# Patient Record
Sex: Female | Born: 1965
Health system: Southern US, Community
[De-identification: ages and names within clinical notes are randomized; demographics above are authoritative.]

## PROBLEM LIST (undated history)

## (undated) DIAGNOSIS — M1990 Unspecified osteoarthritis, unspecified site: Secondary | ICD-10-CM

## (undated) DIAGNOSIS — Z9289 Personal history of other medical treatment: Secondary | ICD-10-CM

## (undated) DIAGNOSIS — D649 Anemia, unspecified: Secondary | ICD-10-CM

## (undated) DIAGNOSIS — R519 Headache, unspecified: Secondary | ICD-10-CM

## (undated) DIAGNOSIS — R51 Headache: Secondary | ICD-10-CM

## (undated) DIAGNOSIS — S46012A Strain of muscle(s) and tendon(s) of the rotator cuff of left shoulder, initial encounter: Secondary | ICD-10-CM

## (undated) DIAGNOSIS — K219 Gastro-esophageal reflux disease without esophagitis: Secondary | ICD-10-CM

## (undated) DIAGNOSIS — M7582 Other shoulder lesions, left shoulder: Secondary | ICD-10-CM

## (undated) HISTORY — DX: Headache: R51

## (undated) HISTORY — DX: Personal history of other medical treatment: Z92.89

## (undated) HISTORY — DX: Headache, unspecified: R51.9

## (undated) HISTORY — PX: WISDOM TOOTH EXTRACTION: SHX21

---

## 1989-04-16 HISTORY — PX: DILATION AND CURETTAGE OF UTERUS: SHX78

## 2005-06-27 ENCOUNTER — Ambulatory Visit: Payer: Self-pay | Admitting: Unknown Physician Specialty

## 2007-12-11 ENCOUNTER — Ambulatory Visit: Payer: Self-pay | Admitting: Family Medicine

## 2008-03-30 ENCOUNTER — Ambulatory Visit: Payer: Self-pay | Admitting: Family Medicine

## 2009-05-25 ENCOUNTER — Ambulatory Visit: Payer: Self-pay | Admitting: Family Medicine

## 2009-06-16 ENCOUNTER — Ambulatory Visit: Payer: Self-pay

## 2009-07-02 ENCOUNTER — Ambulatory Visit: Payer: Self-pay | Admitting: Internal Medicine

## 2011-02-20 ENCOUNTER — Ambulatory Visit: Payer: Self-pay | Admitting: Internal Medicine

## 2011-02-28 ENCOUNTER — Ambulatory Visit: Payer: Self-pay

## 2011-03-29 ENCOUNTER — Ambulatory Visit: Payer: Self-pay

## 2012-02-25 ENCOUNTER — Ambulatory Visit: Payer: Self-pay

## 2012-02-25 LAB — RAPID INFLUENZA A&B ANTIGENS

## 2012-11-12 ENCOUNTER — Ambulatory Visit: Payer: Self-pay | Admitting: Family Medicine

## 2014-03-02 ENCOUNTER — Ambulatory Visit: Payer: Self-pay | Admitting: Internal Medicine

## 2014-09-15 HISTORY — PX: COLONOSCOPY: SHX174

## 2014-12-17 ENCOUNTER — Ambulatory Visit: Payer: Federal, State, Local not specified - PPO

## 2014-12-17 ENCOUNTER — Encounter: Payer: Self-pay | Admitting: Emergency Medicine

## 2014-12-17 ENCOUNTER — Ambulatory Visit
Admission: EM | Admit: 2014-12-17 | Discharge: 2014-12-17 | Disposition: A | Discharge status: Home / Self Care | Payer: Federal, State, Local not specified - PPO | Attending: Family Medicine | Admitting: Family Medicine

## 2014-12-17 DIAGNOSIS — S46911A Strain of unspecified muscle, fascia and tendon at shoulder and upper arm level, right arm, initial encounter: Secondary | ICD-10-CM

## 2014-12-17 DIAGNOSIS — S40011A Contusion of right shoulder, initial encounter: Secondary | ICD-10-CM | POA: Diagnosis not present

## 2014-12-17 MED ORDER — KETOROLAC TROMETHAMINE 60 MG/2ML IM SOLN
60.0000 mg | Freq: Once | INTRAMUSCULAR | Status: AC
  Administered 2014-12-17: 60 mg via INTRAMUSCULAR

## 2014-12-17 MED ORDER — KETOROLAC TROMETHAMINE 10 MG PO TABS: 10.0000 mg | ORAL_TABLET | Freq: Three times a day (TID) | ORAL | Status: DC | PRN

## 2014-12-17 MED ORDER — CYCLOBENZAPRINE HCL 10 MG PO TABS: 10.0000 mg | ORAL_TABLET | Freq: Every day | ORAL | Status: DC

## 2014-12-17 NOTE — ED Provider Notes (Signed)
CSN: [id number]     Arrival date & time 12/17/14  7829 History   First MD Initiated Contact with Patient 12/17/14 0800     Chief Complaint  Patient presents with  . Fall  . Arm Pain    right   (Consider location/radiation/quality/duration/timing/severity/associated sxs/prior Treatment) HPI Comments: 49 yo female with a c/o right shoulder pain for one week after falling in her garage at home. States she slipped on the floor and landed on top of her right shoulder. Has had pain since. 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 Denies numbness, tingling, skin discoloration, fevers, chills. Has been icing and taking motrin with mild relief. Patient denies hitting her head when she fell or loss of consciousness.  Patient is a 49 y.o. female presenting with fall and arm pain. The history is provided by the patient.  Fall  Arm Pain    History reviewed. No pertinent past medical history. History reviewed. No pertinent past surgical history. History reviewed. No pertinent family history. Social History  Substance Use Topics  . Smoking status: Never Smoker   . Smokeless tobacco: Never Used  . Alcohol Use: No   OB History    No data available     Review of Systems  Allergies  Penicillins  Home Medications   Prior to Admission medications   Medication Sig Start Date End Date Taking? Authorizing Provider  Multiple Vitamin (MULTIVITAMIN) tablet Take 1 tablet by mouth daily.   Yes Historical Provider, MD  cyclobenzaprine (FLEXERIL) 10 MG tablet Take 1 tablet (10 mg total) by mouth at bedtime. 12/17/14   Payton Mccallum, MD  ketorolac (TORADOL) 10 MG tablet Take 1 tablet (10 mg total) by mouth every 8 (eight) hours as needed. 12/17/14   Payton Mccallum, MD   Meds Ordered and Administered this Visit   Medications  ketorolac (TORADOL) injection 60 mg (60 mg Intramuscular Given 12/17/14 0828)    BP 127/81 mmHg  Pulse 70  Temp(Src) 97.5 F (36.4  C) (Tympanic)  Resp 16  Ht  (1.651 m)  Wt 160 lb (72.576 kg)  BMI 26.63 kg/m2  SpO2 100%  LMP 12/03/2014 (Approximate) No data found.   Physical Exam  Constitutional: She appears well-developed and well-nourished. No distress.  HENT:  Head: Normocephalic and atraumatic.  Pulmonary/Chest: Effort normal. No respiratory distress.  Musculoskeletal: She exhibits no edema.       Right shoulder: She exhibits tenderness (over the deltoid muscle), pain (with abduction over 90 degress ) and spasm (over the right trapezius and deltoid muscle). She exhibits normal range of motion, no bony tenderness, no swelling, no effusion, no crepitus, no deformity, no laceration and normal strength.  Neurological: She is alert. She has normal reflexes. She displays normal reflexes. No cranial nerve deficit. She exhibits normal muscle tone. Coordination normal.  Skin: She is not diaphoretic.  Nursing note and vitals reviewed.   ED Course  Procedures (including critical care time)  Labs Review Labs Reviewed - No data to display  Imaging Review Dg Shoulder Right  12/17/2014   CLINICAL DATA:  Status post fall 1 week ago onto a concrete floor with a blow to the right shoulder. Continued pain. Initial encounter.  EXAM: RIGHT SHOULDER - 2+ VIEW  COMPARISON:  None.  FINDINGS: There is no acute bony or joint abnormality. Mild to moderate acromioclavicular osteoarthritis is noted. Image right lung and ribs appear normal.  IMPRESSION: No acute finding.  Mild to moderate acromioclavicular osteoarthritis.   Electronically Signed   By: Drusilla Kanner M.D.   On: 12/17/2014 08:42     Visual Acuity Review  Right Eye Distance:   Left Eye Distance:   Bilateral Distance:    Right Eye Near:   Left Eye Near:    Bilateral Near:         MDM   1. Shoulder contusion, right, initial encounter   2. Shoulder strain, right, initial encounter    New Prescriptions   CYCLOBENZAPRINE (FLEXERIL) 10 MG TABLET     Take 1 tablet (10 mg total) by mouth at bedtime.   KETOROLAC (TORADOL) 10 MG TABLET    Take 1 tablet (10 mg total) by mouth every 8 (eight) hours as needed.    Plan: 1. x-ray results and diagnosis reviewed with patient 2. rx as per orders; risks, benefits, potential side effects reviewed with patient 3. Patient given Toradol 60mg  IM x 1 in clinic with improvement of pain symptom 4. Recommend supportive treatment with ice/heat, range of motion exercises 5. F/u prn if symptoms worsen or don't improve    Payton Mccallum, MD 12/17/14 [phone]

## 2014-12-17 NOTE — ED Notes (Signed)
Patient c/o right arm pain for the past week after falling in her garage.  Patient denies hitting her head and any other injuries.

## 2015-07-20 DIAGNOSIS — J208 Acute bronchitis due to other specified organisms: Secondary | ICD-10-CM | POA: Diagnosis not present

## 2015-07-20 DIAGNOSIS — J019 Acute sinusitis, unspecified: Secondary | ICD-10-CM | POA: Diagnosis not present

## 2015-07-20 DIAGNOSIS — B9689 Other specified bacterial agents as the cause of diseases classified elsewhere: Secondary | ICD-10-CM | POA: Diagnosis not present

## 2015-09-21 ENCOUNTER — Other Ambulatory Visit: Payer: Self-pay | Admitting: Obstetrics and Gynecology

## 2015-09-21 DIAGNOSIS — Z1151 Encounter for screening for human papillomavirus (HPV): Secondary | ICD-10-CM | POA: Diagnosis not present

## 2015-09-21 DIAGNOSIS — Z1239 Encounter for other screening for malignant neoplasm of breast: Secondary | ICD-10-CM | POA: Diagnosis not present

## 2015-09-21 DIAGNOSIS — Z124 Encounter for screening for malignant neoplasm of cervix: Secondary | ICD-10-CM | POA: Diagnosis not present

## 2015-09-21 DIAGNOSIS — Z1231 Encounter for screening mammogram for malignant neoplasm of breast: Secondary | ICD-10-CM

## 2015-09-21 DIAGNOSIS — Z01419 Encounter for gynecological examination (general) (routine) without abnormal findings: Secondary | ICD-10-CM | POA: Diagnosis not present

## 2015-09-21 LAB — HM PAP SMEAR

## 2015-10-06 ENCOUNTER — Ambulatory Visit
Admission: RE | Admit: 2015-10-06 | Discharge: 2015-10-06 | Disposition: A | Discharge status: Home / Self Care | Payer: Federal, State, Local not specified - PPO | Source: Ambulatory Visit | Attending: Obstetrics and Gynecology | Admitting: Obstetrics and Gynecology

## 2015-10-06 ENCOUNTER — Other Ambulatory Visit: Payer: Self-pay | Admitting: Obstetrics and Gynecology

## 2015-10-06 DIAGNOSIS — Z1231 Encounter for screening mammogram for malignant neoplasm of breast: Secondary | ICD-10-CM | POA: Diagnosis not present

## 2015-10-06 LAB — HM MAMMOGRAPHY

## 2015-12-06 DIAGNOSIS — K080 Exfoliation of teeth due to systemic causes: Secondary | ICD-10-CM | POA: Diagnosis not present

## 2016-03-19 DIAGNOSIS — M9904 Segmental and somatic dysfunction of sacral region: Secondary | ICD-10-CM | POA: Diagnosis not present

## 2016-03-19 DIAGNOSIS — M5408 Panniculitis affecting regions of neck and back, sacral and sacrococcygeal region: Secondary | ICD-10-CM | POA: Diagnosis not present

## 2016-03-19 DIAGNOSIS — M9903 Segmental and somatic dysfunction of lumbar region: Secondary | ICD-10-CM | POA: Diagnosis not present

## 2016-03-28 DIAGNOSIS — M9903 Segmental and somatic dysfunction of lumbar region: Secondary | ICD-10-CM | POA: Diagnosis not present

## 2016-03-28 DIAGNOSIS — M5408 Panniculitis affecting regions of neck and back, sacral and sacrococcygeal region: Secondary | ICD-10-CM | POA: Diagnosis not present

## 2016-03-28 DIAGNOSIS — M9904 Segmental and somatic dysfunction of sacral region: Secondary | ICD-10-CM | POA: Diagnosis not present

## 2016-04-16 DIAGNOSIS — Z8614 Personal history of Methicillin resistant Staphylococcus aureus infection: Secondary | ICD-10-CM

## 2016-04-16 HISTORY — DX: Personal history of Methicillin resistant Staphylococcus aureus infection: Z86.14

## 2016-04-26 DIAGNOSIS — M5408 Panniculitis affecting regions of neck and back, sacral and sacrococcygeal region: Secondary | ICD-10-CM | POA: Diagnosis not present

## 2016-04-26 DIAGNOSIS — M9903 Segmental and somatic dysfunction of lumbar region: Secondary | ICD-10-CM | POA: Diagnosis not present

## 2016-04-26 DIAGNOSIS — M9904 Segmental and somatic dysfunction of sacral region: Secondary | ICD-10-CM | POA: Diagnosis not present

## 2016-05-22 DIAGNOSIS — K080 Exfoliation of teeth due to systemic causes: Secondary | ICD-10-CM | POA: Diagnosis not present

## 2016-07-06 ENCOUNTER — Ambulatory Visit
Admission: EM | Admit: 2016-07-06 | Discharge: 2016-07-06 | Disposition: A | Discharge status: Home / Self Care | Payer: Federal, State, Local not specified - PPO | Attending: Family Medicine | Admitting: Family Medicine

## 2016-07-06 DIAGNOSIS — M7989 Other specified soft tissue disorders: Secondary | ICD-10-CM | POA: Diagnosis not present

## 2016-07-06 DIAGNOSIS — L02412 Cutaneous abscess of left axilla: Secondary | ICD-10-CM | POA: Diagnosis not present

## 2016-07-06 DIAGNOSIS — Z880 Allergy status to penicillin: Secondary | ICD-10-CM | POA: Diagnosis not present

## 2016-07-06 DIAGNOSIS — L0291 Cutaneous abscess, unspecified: Secondary | ICD-10-CM

## 2016-07-06 DIAGNOSIS — Z6827 Body mass index (BMI) 27.0-27.9, adult: Secondary | ICD-10-CM | POA: Diagnosis not present

## 2016-07-06 NOTE — ED Provider Notes (Signed)
CSN: [id number]     Arrival date & time 07/06/16  1807 History   First MD Initiated Contact with Patient 07/06/16 1842     Chief Complaint  Patient presents with  . Abscess   (Consider location/radiation/quality/duration/timing/severity/associated sxs/prior Treatment) HPI  Subjective 51 year old female who presents with a abscess in her left axilla that she's had for last 3 days. States that she has not been feeling well today and worsened as the day progressed. She is also been having episodes of hot alternating with cold but her temperature is afebrile today. She became alarmed because the abscess and hardness had extended medially.       History reviewed. No pertinent past medical history. History reviewed. No pertinent surgical history. Family History  Problem Relation Age of Onset  . Breast cancer Neg Hx    Social History  Substance Use Topics  . Smoking status: Never Smoker  . Smokeless tobacco: Never Used  . Alcohol use No   OB History    No data available     Review of Systems  Constitutional: Positive for activity change, chills, fatigue and fever.  Skin: Positive for color change.  All other systems reviewed and are negative.   Allergies  Penicillins  Home Medications   Prior to Admission medications   Medication Sig Start Date End Date Taking? Authorizing Provider  cyclobenzaprine (FLEXERIL) 10 MG tablet Take 1 tablet (10 mg total) by mouth at bedtime. 12/17/14   Payton Mccallum, MD  ketorolac (TORADOL) 10 MG tablet Take 1 tablet (10 mg total) by mouth every 8 (eight) hours as needed. 12/17/14   Payton Mccallum, MD  Multiple Vitamin (MULTIVITAMIN) tablet Take 1 tablet by mouth daily.    Historical Provider, MD   Meds Ordered and Administered this Visit  Medications - No data to display  BP (!) 150/87 (BP Location: Right Arm)   Pulse (!) 106   Temp 98.4 F (36.9 C) (Oral)   Resp 17   Ht 5\' 5"  (1.651 m)   Wt 165 lb (74.8 kg)   LMP 07/02/2016 (Exact Date)    SpO2 100%   BMI 27.46 kg/m  No data found.   Physical Exam  Constitutional: She is oriented to person, place, and time. She appears well-developed and well-nourished. No distress.  HENT:  Head: Normocephalic and atraumatic.  Eyes: Pupils are equal, round, and reactive to light. Right eye exhibits no discharge. Left eye exhibits no discharge.  Musculoskeletal: Normal range of motion.  Neurological: She is alert and oriented to person, place, and time.  Skin: Skin is warm and dry. She is not diaphoretic. There is erythema.  Examination of the left axilla shows abscess in the axillary area that is starting to point at one portion. Is indurated and does not feel fluctuant. However the patient has erythema extending medially and inferiorly from the axilla anterior axillary line that is large indurated and tender.  Psychiatric: She has a normal mood and affect. Her behavior is normal. Judgment and thought content normal.  Nursing note and vitals reviewed.   Urgent Care Course     Procedures (including critical care time)  Labs Review Labs Reviewed - No data to display  Imaging Review No results found.   Visual Acuity Review  Right Eye Distance:   Left Eye Distance:   Bilateral Distance:    Right Eye Near:   Left Eye Near:    Bilateral Near:         MDM   1. Abscess  2. Abscess of left axilla    She was discharged to emergency department because of the need for higher level of care. Also examined by Dr. Thurmond ButtsWade who concurred. She left our facility in stable condition. Will be transported by privately owned vehicle.    Lutricia FeilWilliam P Hutson Luft, PA-C 07/06/16 1904

## 2016-07-06 NOTE — ED Triage Notes (Signed)
Pt c/o red swollen, painful area of the left axillary for the past 3 days.

## 2016-10-03 ENCOUNTER — Ambulatory Visit: Payer: Self-pay | Admitting: Obstetrics and Gynecology

## 2016-11-15 DIAGNOSIS — K080 Exfoliation of teeth due to systemic causes: Secondary | ICD-10-CM | POA: Diagnosis not present

## 2016-11-27 ENCOUNTER — Ambulatory Visit: Payer: Self-pay | Admitting: Obstetrics and Gynecology

## 2016-11-27 ENCOUNTER — Ambulatory Visit (INDEPENDENT_AMBULATORY_CARE_PROVIDER_SITE_OTHER): Payer: Federal, State, Local not specified - PPO | Admitting: Obstetrics and Gynecology

## 2016-11-27 ENCOUNTER — Encounter: Payer: Self-pay | Admitting: Obstetrics and Gynecology

## 2016-11-27 VITALS — BP 124/78 | Ht 65.0 in | Wt 176.0 lb

## 2016-11-27 DIAGNOSIS — Z1239 Encounter for other screening for malignant neoplasm of breast: Secondary | ICD-10-CM

## 2016-11-27 DIAGNOSIS — Z0000 Encounter for general adult medical examination without abnormal findings: Secondary | ICD-10-CM

## 2016-11-27 DIAGNOSIS — Z131 Encounter for screening for diabetes mellitus: Secondary | ICD-10-CM

## 2016-11-27 DIAGNOSIS — Z1231 Encounter for screening mammogram for malignant neoplasm of breast: Secondary | ICD-10-CM | POA: Diagnosis not present

## 2016-11-27 DIAGNOSIS — Z01419 Encounter for gynecological examination (general) (routine) without abnormal findings: Secondary | ICD-10-CM | POA: Diagnosis not present

## 2016-11-27 DIAGNOSIS — Z13220 Encounter for screening for lipoid disorders: Secondary | ICD-10-CM

## 2016-11-27 NOTE — Progress Notes (Signed)
Chief Complaint  Patient presents with  . Annual Exam    HPI:      Ms. Kristina Carrillo is a 51 y.o. [id number] who LMP was Patient's last menstrual period was 11/06/2016., presents today for her annual examination.  Her menses are Q4-6 wks, lasting 6 days; light flow. Menses have improved from last yr.  Dysmenorrhea none. She does not have intermenstrual bleeding.  She does have vasomotor sx occas. Sex activity: single partner, contraception - vasectomy. She does not have vaginal dryness; uses lubricants  Last Pap: September 21, 2015  Results were: no abnormalities /neg HPV DNA.  Hx of STDs: none  Last mammogram: October 06, 2015  Results were: normal--routine follow-up in 12 months There is no FH of breast cancer. There is no FH of ovarian cancer. The patient does do self-breast exams.  Colonoscopy: colonoscopy 2 years ago without abnormalities. Repeat due in 10 yrs, FH colon cancer in [facility]  Tobacco use: The patient denies current or previous tobacco use. Alcohol use: none Exercise: very active  She does get adequate calcium and Vitamin D in her diet. She had borderline lipids 2016 and didn't have repeat labs last yr. She is willing to do labs this yr.  Past Medical History:  Diagnosis Date  . Head ache    MENSTRUAL  . History of mammogram 07/20/14; 10/06/15   BIRADS 2; NEG  . History of Papanicolaou smear of cervix 06/16/2012; 09/21/15   -/-; -/-    Past Surgical History:  Procedure Laterality Date  . COLONOSCOPY  09/2014   WNL - DR. REIN - REPEAT 35YRS  . DILATION AND CURETTAGE OF UTERUS  1991    Family History  Problem Relation Age of Onset  . Alzheimer's disease Paternal Aunt   . Alzheimer's disease Paternal Uncle   . Cancer Maternal Grandmother 60       COLON  . Alzheimer's disease Paternal Grandfather   . Breast cancer Neg Hx     Social History   Social History  . Marital status: Married    Spouse name: N/A  . Number of children: 2  . 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 Smoking status: Never Smoker  . Smokeless tobacco: Never Used  . Alcohol use No  . Drug use: No  . Sexual activity: Yes    Birth control/ protection: Surgical     Comment: VASECTOMY   Other Topics Concern  . Not on file   Social History Narrative  . No narrative on file     Current Outpatient Prescriptions:  Marland Kitchen.  Multiple Vitamin (MULTIVITAMIN) tablet, Take 1 tablet by mouth daily., Disp: , Rfl:  .  Cholecalciferol (VITAMIN D-3) 5000 units TABS, Take 1 tablet by mouth daily., Disp: , Rfl:  .  cyclobenzaprine (FLEXERIL) 10 MG tablet, Take 1 tablet (10 mg total) by mouth at bedtime. (Patient not taking: Reported on 11/27/2016), Disp: 15 tablet, Rfl: 0 .  ketorolac (TORADOL) 10 MG tablet, Take 1 tablet (10 mg total) by mouth every 8 (eight) hours as needed. (Patient not taking: Reported on 11/27/2016), Disp: 15 tablet, Rfl: 0 .  loratadine (CLARITIN) 10 MG tablet, Take 10 mg by mouth daily., Disp: , Rfl:    ROS:  Review of Systems  Constitutional: Negative for fatigue, fever and unexpected weight change.  Respiratory: Negative for cough, shortness of breath and wheezing.   Cardiovascular: Negative for chest pain,  palpitations and leg swelling.  Gastrointestinal: Negative for blood in stool, constipation, diarrhea, nausea and vomiting.  Endocrine: Negative for cold intolerance, heat intolerance and polyuria.  Genitourinary: Negative for dyspareunia, dysuria, flank pain, frequency, genital sores, hematuria, menstrual problem, pelvic pain, urgency, vaginal bleeding, vaginal discharge and vaginal pain.  Musculoskeletal: Negative for back pain, joint swelling and myalgias.  Skin: Negative for rash.  Neurological: Negative for dizziness, syncope, light-headedness, numbness and headaches.  Hematological: Negative for adenopathy.  Psychiatric/Behavioral: Negative for agitation, confusion, sleep disturbance and  suicidal ideas. The patient is not nervous/anxious.      Objective: BP 124/78   Ht 5\' 5"  (1.651 m)   Wt 176 lb (79.8 kg)   LMP 11/06/2016   BMI 29.29 kg/m    Physical Exam  Constitutional: She is oriented to person, place, and time. She appears well-developed and well-nourished.  Genitourinary: Vagina normal and uterus normal. There is no rash or tenderness on the right labia. There is no rash or tenderness on the left labia. No erythema or tenderness in the vagina. No vaginal discharge found. Right adnexum does not display mass and does not display tenderness. Left adnexum does not display mass and does not display tenderness. Cervix does not exhibit motion tenderness or polyp. Uterus is not enlarged or tender.  Neck: Normal range of motion. No thyromegaly present.  Cardiovascular: Normal rate, regular rhythm and normal heart sounds.   No murmur heard. Pulmonary/Chest: Effort normal and breath sounds normal. Right breast exhibits no mass, no nipple discharge, no skin change and no tenderness. Left breast exhibits no mass, no nipple discharge, no skin change and no tenderness.  Abdominal: Soft. There is no tenderness. There is no guarding.  Musculoskeletal: Normal range of motion.  Neurological: She is alert and oriented to person, place, and time. No cranial nerve deficit.  Psychiatric: She has a normal mood and affect. Her behavior is normal.  Vitals reviewed.   Assessment/Plan:  Encounter for annual routine gynecological examination  Screening for breast cancer - Pt to sched mammo. - Plan: MM DIGITAL SCREENING BILATERAL  Blood tests for routine general physical examination - Plan: Comprehensive metabolic panel, Lipid panel, Hemoglobin A1c  Screening cholesterol level - Plan: Lipid panel  Screening for diabetes mellitus - Plan: Hemoglobin A1c          GYN counsel mammography screening, menopause, adequate intake of calcium and vitamin D    F/U  Return in about 1 year  (around 11/27/2017).  Lysha Schrade B. Sharayah Renfrow, PA-C 11/27/2016 8:54 AM

## 2016-11-28 ENCOUNTER — Ambulatory Visit
Admission: RE | Admit: 2016-11-28 | Discharge: 2016-11-28 | Disposition: A | Discharge status: Home / Self Care | Payer: Federal, State, Local not specified - PPO | Source: Ambulatory Visit | Attending: Obstetrics and Gynecology | Admitting: Obstetrics and Gynecology

## 2016-11-28 DIAGNOSIS — Z1231 Encounter for screening mammogram for malignant neoplasm of breast: Secondary | ICD-10-CM | POA: Insufficient documentation

## 2016-11-28 DIAGNOSIS — Z1239 Encounter for other screening for malignant neoplasm of breast: Secondary | ICD-10-CM

## 2016-11-29 ENCOUNTER — Encounter: Payer: Self-pay | Admitting: Obstetrics and Gynecology

## 2017-05-09 DIAGNOSIS — K080 Exfoliation of teeth due to systemic causes: Secondary | ICD-10-CM | POA: Diagnosis not present

## 2017-06-30 DIAGNOSIS — W208XXA Other cause of strike by thrown, projected or falling object, initial encounter: Secondary | ICD-10-CM | POA: Diagnosis not present

## 2017-06-30 DIAGNOSIS — S01412A Laceration without foreign body of left cheek and temporomandibular area, initial encounter: Secondary | ICD-10-CM | POA: Diagnosis not present

## 2017-06-30 DIAGNOSIS — R6884 Jaw pain: Secondary | ICD-10-CM | POA: Diagnosis not present

## 2017-11-15 DIAGNOSIS — K080 Exfoliation of teeth due to systemic causes: Secondary | ICD-10-CM | POA: Diagnosis not present

## 2017-12-15 IMAGING — MG MM DIGITAL SCREENING BILAT W/ TOMO W/ CAD
8 of 12 series · 8 of 28 positions shown · non-contrast
Comparison: Previous exam(s).

CLINICAL DATA: Screening.

EXAM:
2D DIGITAL SCREENING BILATERAL MAMMOGRAM WITH CAD AND ADJUNCT TOMO

[R MLO synth-2D]
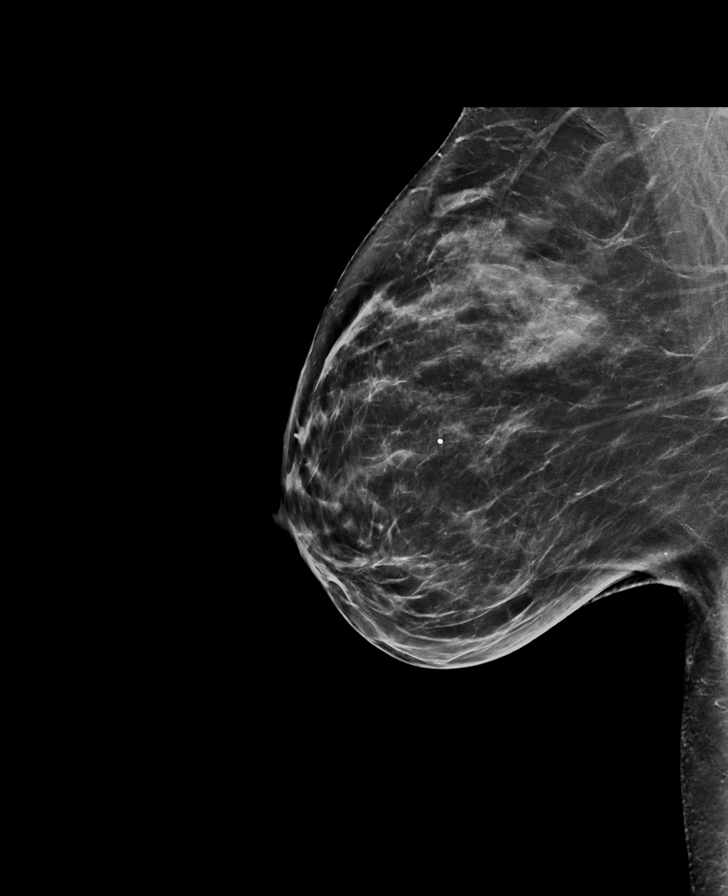

[R MLO]
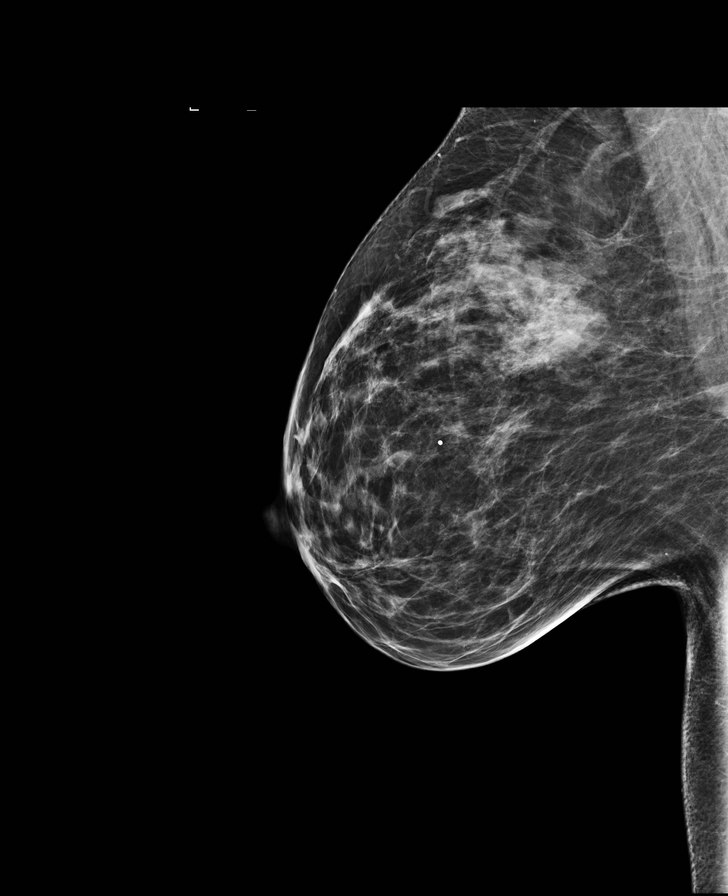

[R CC synth-2D]
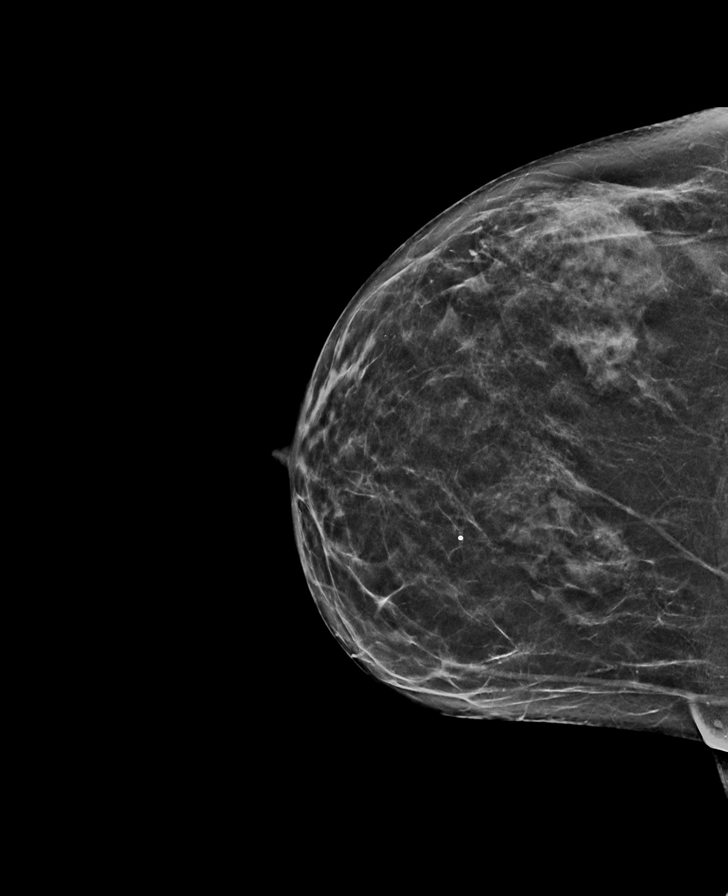

[L MLO]
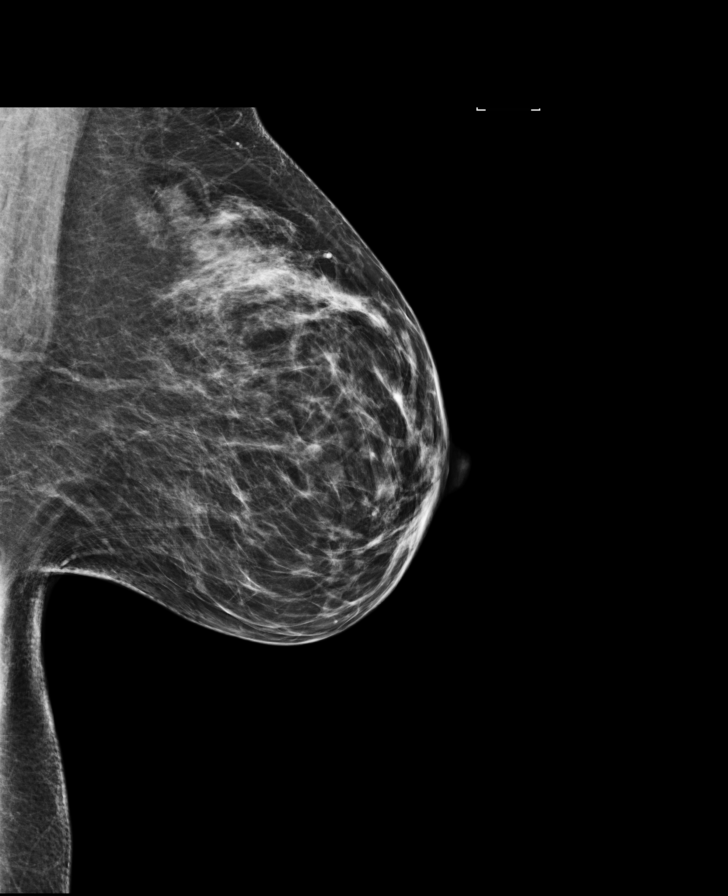

[L CC]
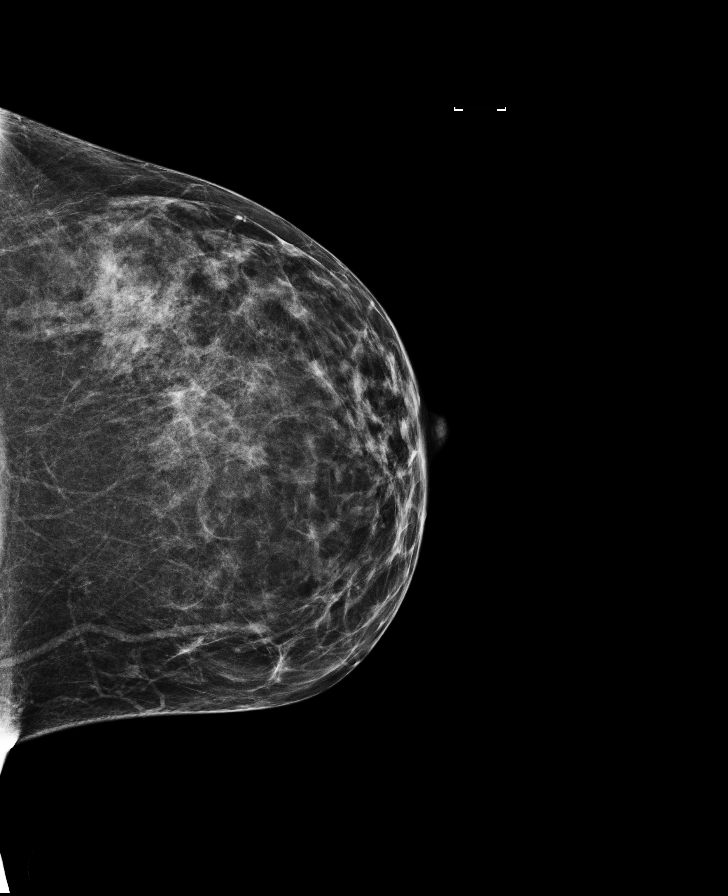

[L MLO synth-2D]
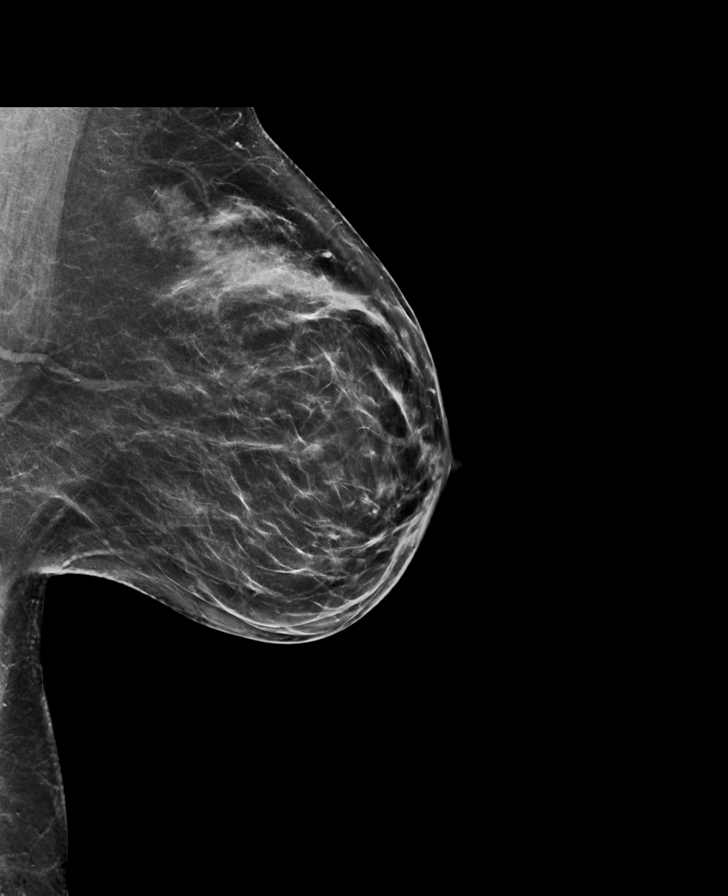

[L CC synth-2D]
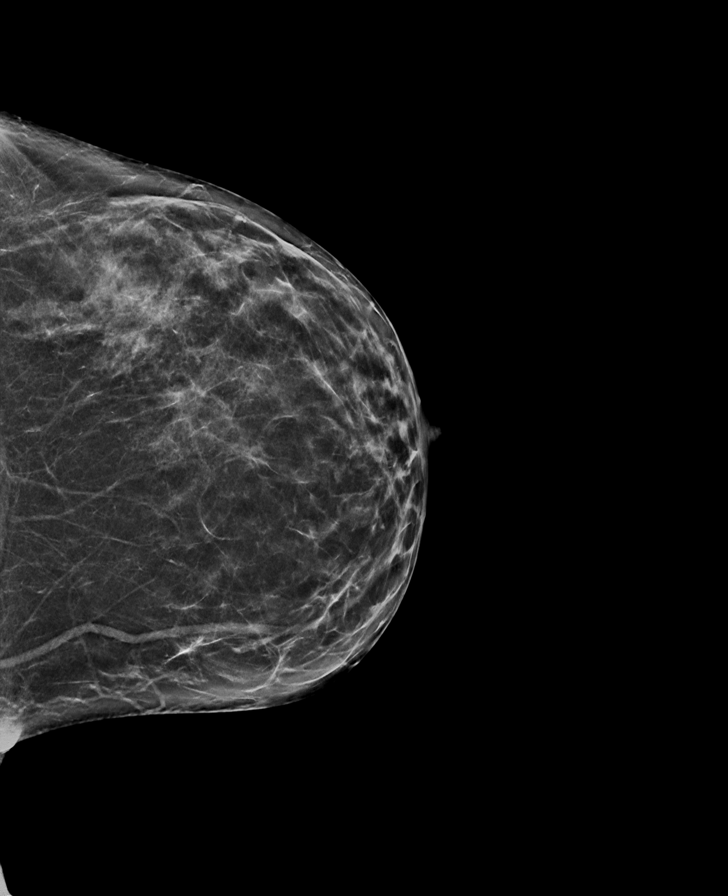

[R CC]
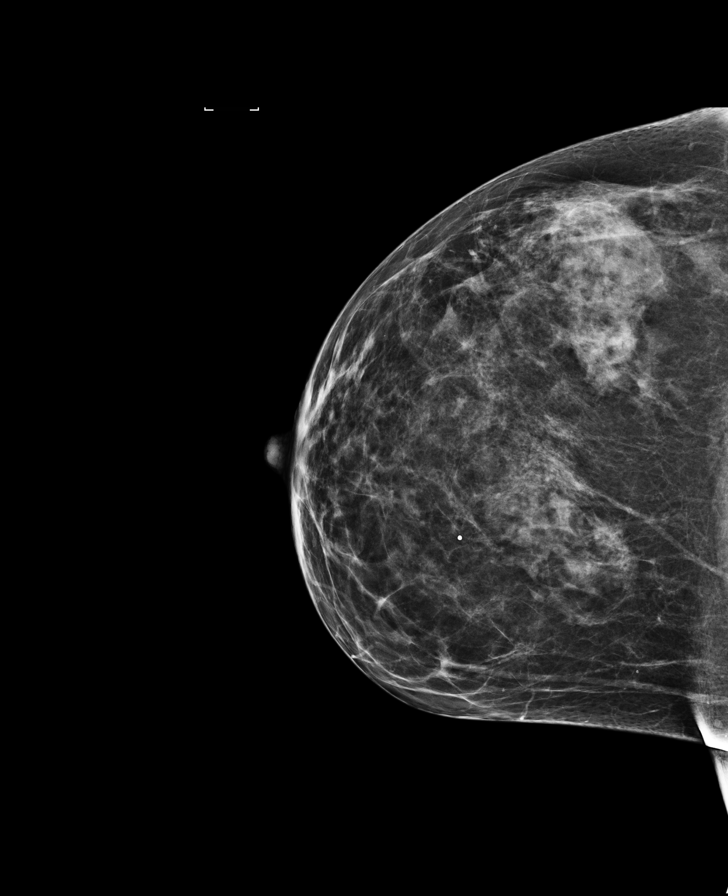

[8 of 28 positions shown; findings below may reference images not displayed]

ACR Breast Density Category c: The breast tissue is heterogeneously
dense, which may obscure small masses.
FINDINGS: There are no findings suspicious for malignancy. Images were
processed with CAD.
IMPRESSION: No mammographic evidence of malignancy. A result letter of this
screening mammogram will be mailed directly to the patient.

RECOMMENDATION:
Screening mammogram in one year. (Code:[id number]-[id number])

BI-RADS CATEGORY  1: Negative.

## 2017-12-31 DIAGNOSIS — K080 Exfoliation of teeth due to systemic causes: Secondary | ICD-10-CM | POA: Diagnosis not present

## 2018-01-23 DIAGNOSIS — K080 Exfoliation of teeth due to systemic causes: Secondary | ICD-10-CM | POA: Diagnosis not present

## 2018-01-31 ENCOUNTER — Encounter: Payer: Self-pay | Admitting: Obstetrics and Gynecology

## 2018-01-31 DIAGNOSIS — Z1231 Encounter for screening mammogram for malignant neoplasm of breast: Secondary | ICD-10-CM | POA: Diagnosis not present

## 2018-01-31 DIAGNOSIS — K080 Exfoliation of teeth due to systemic causes: Secondary | ICD-10-CM | POA: Diagnosis not present

## 2018-02-17 ENCOUNTER — Encounter: Payer: Self-pay | Admitting: Obstetrics and Gynecology

## 2018-03-03 DIAGNOSIS — K080 Exfoliation of teeth due to systemic causes: Secondary | ICD-10-CM | POA: Diagnosis not present

## 2018-04-30 DIAGNOSIS — K080 Exfoliation of teeth due to systemic causes: Secondary | ICD-10-CM | POA: Diagnosis not present

## 2018-05-05 DIAGNOSIS — M5408 Panniculitis affecting regions of neck and back, sacral and sacrococcygeal region: Secondary | ICD-10-CM | POA: Diagnosis not present

## 2018-05-05 DIAGNOSIS — M9903 Segmental and somatic dysfunction of lumbar region: Secondary | ICD-10-CM | POA: Diagnosis not present

## 2018-05-05 DIAGNOSIS — M9904 Segmental and somatic dysfunction of sacral region: Secondary | ICD-10-CM | POA: Diagnosis not present

## 2018-05-23 DIAGNOSIS — K080 Exfoliation of teeth due to systemic causes: Secondary | ICD-10-CM | POA: Diagnosis not present

## 2018-07-08 DIAGNOSIS — Z13220 Encounter for screening for lipoid disorders: Secondary | ICD-10-CM | POA: Diagnosis not present

## 2018-07-08 DIAGNOSIS — M25531 Pain in right wrist: Secondary | ICD-10-CM | POA: Diagnosis not present

## 2018-07-08 DIAGNOSIS — R202 Paresthesia of skin: Secondary | ICD-10-CM | POA: Diagnosis not present

## 2018-07-08 DIAGNOSIS — M47816 Spondylosis without myelopathy or radiculopathy, lumbar region: Secondary | ICD-10-CM | POA: Diagnosis not present

## 2018-07-08 DIAGNOSIS — Z1159 Encounter for screening for other viral diseases: Secondary | ICD-10-CM | POA: Diagnosis not present

## 2018-07-08 DIAGNOSIS — M79641 Pain in right hand: Secondary | ICD-10-CM | POA: Diagnosis not present

## 2018-07-08 DIAGNOSIS — Z131 Encounter for screening for diabetes mellitus: Secondary | ICD-10-CM | POA: Diagnosis not present

## 2018-07-08 DIAGNOSIS — M778 Other enthesopathies, not elsewhere classified: Secondary | ICD-10-CM | POA: Diagnosis not present

## 2018-07-08 DIAGNOSIS — Z114 Encounter for screening for human immunodeficiency virus [HIV]: Secondary | ICD-10-CM | POA: Diagnosis not present

## 2018-07-22 ENCOUNTER — Other Ambulatory Visit: Payer: Self-pay | Admitting: Acute Care

## 2018-07-22 DIAGNOSIS — R202 Paresthesia of skin: Secondary | ICD-10-CM | POA: Diagnosis not present

## 2018-07-22 DIAGNOSIS — M4807 Spinal stenosis, lumbosacral region: Secondary | ICD-10-CM

## 2018-08-22 ENCOUNTER — Other Ambulatory Visit: Payer: Self-pay

## 2018-08-22 ENCOUNTER — Ambulatory Visit
Admission: RE | Admit: 2018-08-22 | Discharge: 2018-08-22 | Disposition: A | Discharge status: Home / Self Care | Payer: Federal, State, Local not specified - PPO | Source: Ambulatory Visit | Attending: Acute Care | Admitting: Acute Care

## 2018-08-22 DIAGNOSIS — M48061 Spinal stenosis, lumbar region without neurogenic claudication: Secondary | ICD-10-CM | POA: Diagnosis not present

## 2018-08-22 DIAGNOSIS — M4807 Spinal stenosis, lumbosacral region: Secondary | ICD-10-CM | POA: Insufficient documentation

## 2018-08-22 DIAGNOSIS — M5127 Other intervertebral disc displacement, lumbosacral region: Secondary | ICD-10-CM | POA: Diagnosis not present

## 2018-09-02 DIAGNOSIS — R200 Anesthesia of skin: Secondary | ICD-10-CM | POA: Diagnosis not present

## 2018-09-17 DIAGNOSIS — R202 Paresthesia of skin: Secondary | ICD-10-CM | POA: Diagnosis not present

## 2018-10-03 DIAGNOSIS — M5136 Other intervertebral disc degeneration, lumbar region: Secondary | ICD-10-CM | POA: Diagnosis not present

## 2018-10-03 DIAGNOSIS — M5416 Radiculopathy, lumbar region: Secondary | ICD-10-CM | POA: Diagnosis not present

## 2018-11-14 DIAGNOSIS — M5136 Other intervertebral disc degeneration, lumbar region: Secondary | ICD-10-CM | POA: Diagnosis not present

## 2018-11-14 DIAGNOSIS — M5416 Radiculopathy, lumbar region: Secondary | ICD-10-CM | POA: Diagnosis not present

## 2019-01-26 ENCOUNTER — Telehealth: Payer: Self-pay | Admitting: Obstetrics and Gynecology

## 2019-01-26 ENCOUNTER — Other Ambulatory Visit: Payer: Self-pay | Admitting: Obstetrics and Gynecology

## 2019-01-26 DIAGNOSIS — Z1231 Encounter for screening mammogram for malignant neoplasm of breast: Secondary | ICD-10-CM

## 2019-01-26 NOTE — Telephone Encounter (Signed)
Pls notify pt. Thx

## 2019-01-26 NOTE — Telephone Encounter (Signed)
Pt will be going to [place].

## 2019-01-26 NOTE — Telephone Encounter (Signed)
Last time she did it in [place]. Is this where she wants to go again?

## 2019-01-26 NOTE — Telephone Encounter (Signed)
Pt aware.

## 2019-01-26 NOTE — Telephone Encounter (Signed)
Patient is calling requiring for a mammogram order. Please advise

## 2019-01-26 NOTE — Telephone Encounter (Signed)
Order placed. Pt will need to sign medical record release at [facility] for prior images from [facility] to be sent to them for mammo comparison.

## 2019-02-05 NOTE — Progress Notes (Signed)
Chief Complaint  Patient presents with  . Gynecologic Exam    HPI:      Ms. Kristina Carrillo is a 53 y.o. [id number] who LMP was No LMP recorded (lmp unknown). (Menstrual status: Irregular Periods)., presents today for her annual examination.  Her menses are Q3-6 months, lasting 3 days spotting.   Dysmenorrhea none. She does not have intermenstrual bleeding. She does have vasomotor sx occas.  Sex activity: single partner, contraception - vasectomy. She does not have vaginal dryness; uses lubricants  Last Pap: September 21, 2015  Results were: no abnormalities /neg HPV DNA.  Hx of STDs: none  Last mammogram: 01/31/18  Results were: normal--routine follow-up in 12 months There is no FH of breast cancer. There is no FH of ovarian cancer. The patient does do self-breast exams.  Colonoscopy: colonoscopy 2016 without abnormalities. Repeat due after 10 yrs, FH colon cancer in [facility]  Tobacco use: The patient denies current or previous tobacco use. Alcohol use: none  No drug use.  Exercise: very active  She does get adequate calcium and Vitamin D in her diet. Labs with PCP this yr.  Past Medical History:  Diagnosis Date  . Head ache    MENSTRUAL  . History of mammogram 07/20/14; 10/06/15   BIRADS 2; NEG  . History of Papanicolaou smear of cervix 06/16/2012; 09/21/15   -/-; -/-    Past Surgical History:  Procedure Laterality Date  . COLONOSCOPY  09/2014   WNL - DR. REIN - REPEAT 41YRS  . DILATION AND CURETTAGE OF UTERUS  1991    Family History  Problem Relation Age of Onset  . Alzheimer's disease Paternal Aunt   . Alzheimer's disease Paternal Uncle   . Cancer Maternal Grandmother 60       COLON  . Alzheimer's disease Paternal Grandfather   . Breast cancer Neg Hx     Social History   Socioeconomic History  . Marital status: Married    Spouse name: Not on file  . Number of children: 2  . Years of education: 65  . Highest education level: Not on file  Occupational History  .  Occupation: Equities trader  . Financial resource strain: Not on file  . Food insecurity    Worry: Not on file    Inability: Not on file  . Transportation needs    Medical: Not on file    Non-medical: Not on file  Tobacco Use  . Smoking status: Never Smoker  . Smokeless tobacco: Never Used  Substance and Sexual Activity  . Alcohol use: No  . Drug use: No  . Sexual activity: Yes    Birth control/protection: Surgical    Comment: VASECTOMY  Lifestyle  . Physical activity    Days per week: Not on file    Minutes per session: Not on file  . Stress: Not on file  Relationships  . Social Musician on phone: Not on file    Gets together: Not on file    Attends religious service: Not on file    Active member of club or organization: Not on file    Attends meetings of clubs or organizations: Not on file    Relationship status: Not on file  . Intimate partner violence    Fear of current or ex partner: Not on file    Emotionally abused: Not on file    Physically abused: Not on file    Forced sexual activity: Not on  file  Other Topics Concern  . Not on file  Social History Narrative  . Not on file     Current Outpatient Medications:  .  fluticasone (FLONASE) 50 MCG/ACT nasal spray, Place into the nose., Disp: , Rfl:  .  loratadine (CLARITIN) 10 MG tablet, Take 10 mg by mouth daily., Disp: , Rfl:  .  Multiple Vitamin (MULTIVITAMIN) tablet, Take 1 tablet by mouth daily., Disp: , Rfl:  .  pregabalin (LYRICA) 25 MG capsule, 1 po qHS, Disp: , Rfl:    ROS:  Review of Systems  Constitutional: Negative for fatigue, fever and unexpected weight change.  Respiratory: Negative for cough, shortness of breath and wheezing.   Cardiovascular: Negative for chest pain, palpitations and leg swelling.  Gastrointestinal: Negative for blood in stool, constipation, diarrhea, nausea and vomiting.  Endocrine: Negative for cold intolerance, heat intolerance and polyuria.   Genitourinary: Negative for dyspareunia, dysuria, flank pain, frequency, genital sores, hematuria, menstrual problem, pelvic pain, urgency, vaginal bleeding, vaginal discharge and vaginal pain.  Musculoskeletal: Negative for back pain, joint swelling and myalgias.  Skin: Negative for rash.  Neurological: Negative for dizziness, syncope, light-headedness, numbness and headaches.  Hematological: Negative for adenopathy.  Psychiatric/Behavioral: Negative for agitation, confusion, sleep disturbance and suicidal ideas. The patient is not nervous/anxious.      Objective: BP 102/74   Ht  (1.651 m)   Wt 148 lb (67.1 kg)   LMP  (LMP Unknown)   BMI 24.63 kg/m    Physical Exam Constitutional:      Appearance: She is well-developed.  Genitourinary:     Vulva, vagina, uterus, right adnexa and left adnexa normal.     No vulval lesion or tenderness noted.     No vaginal discharge, erythema or tenderness.     No cervical motion tenderness or polyp.     Uterus is not enlarged or tender.     No right or left adnexal mass present.     Right adnexa not tender.     Left adnexa not tender.  Neck:     Musculoskeletal: Normal range of motion.     Thyroid: No thyromegaly.  Cardiovascular:     Rate and Rhythm: Normal rate and regular rhythm.     Heart sounds: Normal heart sounds. No murmur.  Pulmonary:     Effort: Pulmonary effort is normal.     Breath sounds: Normal breath sounds.  Chest:     Breasts:        Right: No mass, nipple discharge, skin change or tenderness.        Left: No mass, nipple discharge, skin change or tenderness.  Abdominal:     Palpations: Abdomen is soft.     Tenderness: There is no abdominal tenderness. There is no guarding.  Musculoskeletal: Normal range of motion.  Neurological:     General: No focal deficit present.     Mental Status: She is alert and oriented to person, place, and time.     Cranial Nerves: No cranial nerve deficit.  Skin:    General: Skin  is warm and dry.  Psychiatric:        Mood and Affect: Mood normal.        Behavior: Behavior normal.        Thought Content: Thought content normal.        Judgment: Judgment normal.  Vitals signs reviewed.     Assessment/Plan:  Encounter for annual routine gynecological examination  Encounter for screening mammogram for  malignant neoplasm of breast - Plan: MM 3D SCREEN BREAST BILATERAL; pt to sched mammo  Perimenopause--f/u prn DUB  Needs flu shot  Need for immunization against influenza - Plan: Flu Vaccine QUAD 36+ mos IM          GYN counsel mammography screening, menopause, adequate intake of calcium and vitamin D    F/U  Return in about 1 year (around 02/06/2020).  Jahmai Finelli B. Skyah Hannon, PA-C 02/06/2019 2:21 PM

## 2019-02-06 ENCOUNTER — Ambulatory Visit (INDEPENDENT_AMBULATORY_CARE_PROVIDER_SITE_OTHER): Payer: Federal, State, Local not specified - PPO | Admitting: Obstetrics and Gynecology

## 2019-02-06 ENCOUNTER — Other Ambulatory Visit: Payer: Self-pay

## 2019-02-06 ENCOUNTER — Encounter: Payer: Self-pay | Admitting: Obstetrics and Gynecology

## 2019-02-06 VITALS — BP 102/74 | Ht 65.0 in | Wt 148.0 lb

## 2019-02-06 DIAGNOSIS — Z01419 Encounter for gynecological examination (general) (routine) without abnormal findings: Secondary | ICD-10-CM

## 2019-02-06 DIAGNOSIS — Z23 Encounter for immunization: Secondary | ICD-10-CM

## 2019-02-06 DIAGNOSIS — Z1231 Encounter for screening mammogram for malignant neoplasm of breast: Secondary | ICD-10-CM

## 2019-02-06 NOTE — Patient Instructions (Signed)
I value your feedback and entrusting us with your care. If you get a [facility] patient survey, I would appreciate you taking the time to let us know about your experience today. Thank you!  [facility]: [phone]  [facility]: [phone]  

## 2019-03-23 ENCOUNTER — Inpatient Hospital Stay: Admission: RE | Admit: 2019-03-23 | Payer: Federal, State, Local not specified - PPO | Source: Ambulatory Visit

## 2019-03-30 ENCOUNTER — Inpatient Hospital Stay: Admission: RE | Admit: 2019-03-30 | Payer: Federal, State, Local not specified - PPO | Source: Ambulatory Visit

## 2019-04-13 ENCOUNTER — Other Ambulatory Visit: Payer: Self-pay

## 2019-04-13 ENCOUNTER — Ambulatory Visit
Admission: RE | Admit: 2019-04-13 | Discharge: 2019-04-13 | Disposition: A | Discharge status: Home / Self Care | Payer: Federal, State, Local not specified - PPO | Source: Ambulatory Visit | Attending: Obstetrics and Gynecology | Admitting: Obstetrics and Gynecology

## 2019-04-13 DIAGNOSIS — Z1231 Encounter for screening mammogram for malignant neoplasm of breast: Secondary | ICD-10-CM | POA: Diagnosis not present

## 2019-04-14 ENCOUNTER — Other Ambulatory Visit: Payer: Self-pay | Admitting: Obstetrics and Gynecology

## 2019-04-14 DIAGNOSIS — R928 Other abnormal and inconclusive findings on diagnostic imaging of breast: Secondary | ICD-10-CM

## 2019-04-14 DIAGNOSIS — N6489 Other specified disorders of breast: Secondary | ICD-10-CM

## 2019-04-20 ENCOUNTER — Ambulatory Visit
Admission: RE | Admit: 2019-04-20 | Discharge: 2019-04-20 | Disposition: A | Discharge status: Home / Self Care | Payer: Federal, State, Local not specified - PPO | Source: Ambulatory Visit | Attending: Obstetrics and Gynecology | Admitting: Obstetrics and Gynecology

## 2019-04-20 DIAGNOSIS — R928 Other abnormal and inconclusive findings on diagnostic imaging of breast: Secondary | ICD-10-CM | POA: Insufficient documentation

## 2019-04-20 DIAGNOSIS — N6489 Other specified disorders of breast: Secondary | ICD-10-CM | POA: Insufficient documentation

## 2019-10-21 ENCOUNTER — Other Ambulatory Visit: Payer: Self-pay | Admitting: Obstetrics and Gynecology

## 2019-10-21 DIAGNOSIS — N6489 Other specified disorders of breast: Secondary | ICD-10-CM

## 2019-11-18 ENCOUNTER — Other Ambulatory Visit: Payer: Federal, State, Local not specified - PPO

## 2019-12-01 ENCOUNTER — Other Ambulatory Visit: Payer: Self-pay

## 2019-12-01 ENCOUNTER — Ambulatory Visit
Admission: RE | Admit: 2019-12-01 | Discharge: 2019-12-01 | Disposition: A | Discharge status: Home / Self Care | Payer: Federal, State, Local not specified - PPO | Source: Ambulatory Visit | Attending: Obstetrics and Gynecology | Admitting: Obstetrics and Gynecology

## 2019-12-01 DIAGNOSIS — N6489 Other specified disorders of breast: Secondary | ICD-10-CM | POA: Diagnosis present

## 2020-03-09 ENCOUNTER — Other Ambulatory Visit: Payer: Self-pay | Admitting: Family Medicine

## 2020-03-09 DIAGNOSIS — Z1231 Encounter for screening mammogram for malignant neoplasm of breast: Secondary | ICD-10-CM

## 2020-04-13 ENCOUNTER — Ambulatory Visit
Admission: RE | Admit: 2020-04-13 | Discharge: 2020-04-13 | Disposition: A | Discharge status: Home / Self Care | Payer: Federal, State, Local not specified - PPO | Source: Ambulatory Visit | Attending: Family Medicine | Admitting: Family Medicine

## 2020-04-13 ENCOUNTER — Other Ambulatory Visit: Payer: Self-pay

## 2020-04-13 DIAGNOSIS — Z1231 Encounter for screening mammogram for malignant neoplasm of breast: Secondary | ICD-10-CM | POA: Insufficient documentation

## 2021-03-06 ENCOUNTER — Other Ambulatory Visit: Payer: Self-pay | Admitting: Family Medicine

## 2021-03-06 DIAGNOSIS — Z1231 Encounter for screening mammogram for malignant neoplasm of breast: Secondary | ICD-10-CM

## 2021-04-14 ENCOUNTER — Other Ambulatory Visit: Payer: Self-pay

## 2021-04-14 ENCOUNTER — Ambulatory Visit
Admission: RE | Admit: 2021-04-14 | Discharge: 2021-04-14 | Disposition: A | Discharge status: Home / Self Care | Payer: Federal, State, Local not specified - PPO | Source: Ambulatory Visit | Attending: Family Medicine | Admitting: Family Medicine

## 2021-04-14 DIAGNOSIS — Z1231 Encounter for screening mammogram for malignant neoplasm of breast: Secondary | ICD-10-CM | POA: Diagnosis present

## 2022-03-13 ENCOUNTER — Other Ambulatory Visit: Payer: Self-pay | Admitting: Family Medicine

## 2022-03-13 DIAGNOSIS — Z1231 Encounter for screening mammogram for malignant neoplasm of breast: Secondary | ICD-10-CM

## 2022-04-17 ENCOUNTER — Ambulatory Visit: Payer: Federal, State, Local not specified - PPO

## 2022-04-25 ENCOUNTER — Ambulatory Visit
Admission: RE | Admit: 2022-04-25 | Discharge: 2022-04-25 | Disposition: A | Discharge status: Home / Self Care | Payer: Federal, State, Local not specified - PPO | Source: Ambulatory Visit | Attending: Family Medicine | Admitting: Family Medicine

## 2022-04-25 DIAGNOSIS — Z1231 Encounter for screening mammogram for malignant neoplasm of breast: Secondary | ICD-10-CM | POA: Insufficient documentation

## 2023-03-21 ENCOUNTER — Other Ambulatory Visit: Payer: Self-pay | Admitting: Family Medicine

## 2023-03-21 DIAGNOSIS — Z1231 Encounter for screening mammogram for malignant neoplasm of breast: Secondary | ICD-10-CM

## 2023-04-30 ENCOUNTER — Ambulatory Visit
Admission: RE | Admit: 2023-04-30 | Discharge: 2023-04-30 | Disposition: A | Discharge status: Home / Self Care | Payer: Federal, State, Local not specified - PPO | Source: Ambulatory Visit | Attending: Family Medicine | Admitting: Family Medicine

## 2023-04-30 DIAGNOSIS — Z1231 Encounter for screening mammogram for malignant neoplasm of breast: Secondary | ICD-10-CM | POA: Diagnosis present

## 2023-11-12 ENCOUNTER — Other Ambulatory Visit: Payer: Self-pay | Admitting: Surgery

## 2023-11-12 DIAGNOSIS — S46012A Strain of muscle(s) and tendon(s) of the rotator cuff of left shoulder, initial encounter: Secondary | ICD-10-CM

## 2023-11-12 DIAGNOSIS — M7582 Other shoulder lesions, left shoulder: Secondary | ICD-10-CM

## 2023-11-19 ENCOUNTER — Ambulatory Visit
Admission: RE | Admit: 2023-11-19 | Discharge: 2023-11-19 | Disposition: A | Discharge status: Home / Self Care | Source: Ambulatory Visit | Attending: Surgery | Admitting: Surgery

## 2023-11-19 DIAGNOSIS — S46012A Strain of muscle(s) and tendon(s) of the rotator cuff of left shoulder, initial encounter: Secondary | ICD-10-CM | POA: Diagnosis present

## 2023-11-19 DIAGNOSIS — M7582 Other shoulder lesions, left shoulder: Secondary | ICD-10-CM | POA: Insufficient documentation

## 2023-12-05 ENCOUNTER — Other Ambulatory Visit: Payer: Self-pay | Admitting: Surgery

## 2023-12-10 ENCOUNTER — Other Ambulatory Visit: Payer: Self-pay

## 2023-12-10 ENCOUNTER — Encounter
Admission: RE | Admit: 2023-12-10 | Discharge: 2023-12-10 | Disposition: A | Discharge status: Home / Self Care | Source: Ambulatory Visit | Attending: Surgery | Admitting: Surgery

## 2023-12-10 HISTORY — DX: Strain of muscle(s) and tendon(s) of the rotator cuff of left shoulder, initial encounter: S46.012A

## 2023-12-10 HISTORY — DX: Anemia, unspecified: D64.9

## 2023-12-10 HISTORY — DX: Other shoulder lesions, left shoulder: M75.82

## 2023-12-10 HISTORY — DX: Unspecified osteoarthritis, unspecified site: M19.90

## 2023-12-10 HISTORY — DX: Gastro-esophageal reflux disease without esophagitis: K21.9

## 2023-12-10 NOTE — Patient Instructions (Addendum)
 Your procedure is scheduled on: 12/18/23 - Wednesday Report to the Registration Desk on the 1st floor of the [facility]. To find out your arrival time, please call [phone] between 1PM - 3PM on: 12/17/23 - Tuesday If your arrival time is 6:00 am, do not arrive before that time as the [facility] entrance doors do not open until 6:00 am.  REMEMBER: Instructions that are not followed completely may result in serious medical risk, up to and including death; or upon the discretion of your surgeon and anesthesiologist your surgery may need to be rescheduled.  Do not eat food after midnight the night before surgery.  No gum chewing or hard candies.  You may however, drink CLEAR liquids up to 2 hours before you are scheduled to arrive for your surgery. Do not drink anything within 2 hours of your scheduled arrival time.  Clear liquids include: - water  - apple juice without pulp - gatorade (not RED colors) - black coffee or tea (Do NOT add milk or creamers to the coffee or tea) Do NOT drink anything that is not on this list.  In addition, your doctor has ordered for you to drink the provided:  Ensure Pre-Surgery Clear Carbohydrate Drink  Drinking this carbohydrate drink up to two hours before surgery helps to reduce insulin resistance and improve patient outcomes. Please complete drinking 2 hours before scheduled arrival time.  One week prior to surgery: Stop Anti-inflammatories (NSAIDS) such as Advil, Aleve, Ibuprofen, Motrin, Naproxen, Naprosyn and Aspirin based products such as Excedrin, Goody's Powder, BC Powder. You may take Tylenol if needed for pain up until the day of surgery.  Stop ANY OVER THE COUNTER supplements until after surgery. MultiVitamin, Vitamin D-Vitamin K .  ON THE DAY OF SURGERY ONLY TAKE THESE MEDICATIONS WITH SIPS OF WATER:  fluticasone (FLONASE)   No Alcohol for 24 hours before or after surgery.  No Smoking including e-cigarettes for 24 hours before  surgery.  No chewable tobacco products for at least 6 hours before surgery.  No nicotine patches on the day of surgery.  Do not use any recreational drugs for at least a week (preferably 2 weeks) before your surgery.  Please be advised that the combination of cocaine and anesthesia may have negative outcomes, up to and including death. If you test positive for cocaine, your surgery will be cancelled.  On the morning of surgery brush your teeth with toothpaste and water, you may rinse your mouth with mouthwash if you wish. Do not swallow any toothpaste or mouthwash.  Use CHG Soap or wipes as directed on instruction sheet.  Do not wear jewelry, make-up, hairpins, clips or nail polish.  For welded (permanent) jewelry: bracelets, anklets, waist bands, etc.  Please have this removed prior to surgery.  If it is not removed, there is a chance that hospital personnel will need to cut it off on the day of surgery.  Do not wear lotions, powders, or perfumes.   Do not shave body hair from the neck down 48 hours before surgery.  Contact lenses, hearing aids and dentures may not be worn into surgery.  Do not bring valuables to the hospital. [facility] is not responsible for any missing/lost belongings or valuables.   Notify your doctor if there is any change in your medical condition (cold, fever, infection).  Wear comfortable clothing (specific to your surgery type) to the hospital.  After surgery, you can help prevent lung complications by doing breathing exercises.  Take deep  breaths and cough every 1-2 hours. Your doctor may order a device called an Incentive Spirometer to help you take deep breaths.  When coughing or sneezing, hold a pillow firmly against your incision with both hands. This is called "splinting." Doing this helps protect your incision. It also decreases belly discomfort.  If you are being admitted to the hospital overnight, leave your suitcase in the car. After surgery  it may be brought to your room.  In case of increased patient census, it may be necessary for you, the patient, to continue your postoperative care in the Same Day Surgery department.  If you are being discharged the day of surgery, you will not be allowed to drive home. You will need a responsible individual to drive you home and stay with you for 24 hours after surgery.   If you are taking public transportation, you will need to have a responsible individual with you.  Please call the Pre-admissions Testing Dept. at [phone] if you have any questions about these instructions.  Surgery Visitation Policy:  Patients having surgery or a procedure may have two visitors.  Children under the age of 49 must have an adult with them who is not the patient.  Inpatient Visitation:    Visiting hours are 7 a.m. to 8 p.m. Up to four visitors are allowed at one time in a patient room. The visitors may rotate out with other people during the day.  One visitor age 34 or older may stay with the patient overnight and must be in the room by 8 p.m.   Merchandiser, retail to address health-related social needs:  [email or web address]                                                                                                             Preparing for Surgery with CHLORHEXIDINE GLUCONATE (CHG) Soap  Chlorhexidine Gluconate (CHG) Soap  o An antiseptic cleaner that kills germs and bonds with the skin to continue killing germs even after washing  o Used for showering the night before surgery and morning of surgery  Before surgery, you can play an important role by reducing the number of germs on your skin.  CHG (Chlorhexidine gluconate) soap is an antiseptic cleanser which kills germs and bonds with the skin to continue killing germs even after washing.  Please do not use if you have an allergy to CHG or antibacterial soaps. If your skin becomes reddened/irritated stop using  the CHG.  1. Shower the NIGHT BEFORE SURGERY and the MORNING OF SURGERY with CHG soap.  2. If you choose to wash your hair, wash your hair first as usual with your normal shampoo.  3. After shampooing, rinse your hair and body thoroughly to remove the shampoo.  4. Use CHG as you would any other liquid soap. You can apply CHG directly to the skin and wash gently with a scrungie or a clean washcloth.  5. Apply the CHG soap to your body only from the neck down. Do not use on open wounds  or open sores. Avoid contact with your eyes, ears, mouth, and genitals (private parts). Wash face and genitals (private parts) with your normal soap.  6. Wash thoroughly, paying special attention to the area where your surgery will be performed.  7. Thoroughly rinse your body with warm water.  8. Do not shower/wash with your normal soap after using and rinsing off the CHG soap.  9. Pat yourself dry with a clean towel.  10. Wear clean pajamas to bed the night before surgery.  12. Place clean sheets on your bed the night of your first shower and do not sleep with pets.  13. Shower again with the CHG soap on the day of surgery prior to arriving at the hospital.  14. Do not apply any deodorants/lotions/powders.  15. Please wear clean clothes to the hospital.

## 2023-12-18 ENCOUNTER — Ambulatory Visit: Payer: Self-pay | Admitting: Certified Registered"

## 2023-12-18 ENCOUNTER — Other Ambulatory Visit: Payer: Self-pay

## 2023-12-18 ENCOUNTER — Ambulatory Visit
Admission: RE | Admit: 2023-12-18 | Discharge: 2023-12-18 | Disposition: A | Discharge status: Home / Self Care | Attending: Surgery | Admitting: Surgery

## 2023-12-18 ENCOUNTER — Ambulatory Visit: Payer: Self-pay | Admitting: Urgent Care

## 2023-12-18 ENCOUNTER — Encounter: Payer: Self-pay | Admitting: Surgery

## 2023-12-18 ENCOUNTER — Encounter
Admission: RE | Disposition: A | Discharge status: Home / Self Care | Payer: Self-pay | Source: Home / Self Care | Attending: Surgery

## 2023-12-18 ENCOUNTER — Ambulatory Visit

## 2023-12-18 DIAGNOSIS — M7542 Impingement syndrome of left shoulder: Secondary | ICD-10-CM | POA: Insufficient documentation

## 2023-12-18 DIAGNOSIS — S46012A Strain of muscle(s) and tendon(s) of the rotator cuff of left shoulder, initial encounter: Secondary | ICD-10-CM | POA: Diagnosis present

## 2023-12-18 DIAGNOSIS — M25512 Pain in left shoulder: Secondary | ICD-10-CM | POA: Diagnosis present

## 2023-12-18 DIAGNOSIS — K219 Gastro-esophageal reflux disease without esophagitis: Secondary | ICD-10-CM | POA: Diagnosis not present

## 2023-12-18 DIAGNOSIS — M7582 Other shoulder lesions, left shoulder: Secondary | ICD-10-CM | POA: Diagnosis present

## 2023-12-18 DIAGNOSIS — X58XXXA Exposure to other specified factors, initial encounter: Secondary | ICD-10-CM | POA: Insufficient documentation

## 2023-12-18 DIAGNOSIS — Z79899 Other long term (current) drug therapy: Secondary | ICD-10-CM | POA: Diagnosis not present

## 2023-12-18 DIAGNOSIS — M7522 Bicipital tendinitis, left shoulder: Secondary | ICD-10-CM | POA: Diagnosis not present

## 2023-12-18 HISTORY — PX: SHOULDER OPEN ROTATOR CUFF REPAIR: SHX2407

## 2023-12-18 HISTORY — PX: SUBACROMIAL DECOMPRESSION: SHX5174

## 2023-12-18 HISTORY — PX: BICEPT TENODESIS: SHX5116

## 2023-12-18 HISTORY — PX: POSTERIOR LUMBAR FUSION 2 WITH HARDWARE REMOVAL: SHX7297

## 2023-12-18 SURGERY — ARTHROSCOPY, SHOULDER WITH DEBRIDEMENT
Anesthesia: General | Site: Shoulder | Laterality: Left

## 2023-12-18 MED ORDER — FENTANYL CITRATE (PF) 100 MCG/2ML IJ SOLN
INTRAMUSCULAR | Status: AC
  Filled 2023-12-18: qty 2

## 2023-12-18 MED ORDER — DEXMEDETOMIDINE HCL IN NACL 80 MCG/20ML IV SOLN
INTRAVENOUS | Status: DC | PRN
  Administered 2023-12-18: 8 ug via INTRAVENOUS
  Administered 2023-12-18: 4 ug via INTRAVENOUS

## 2023-12-18 MED ORDER — ROCURONIUM BROMIDE 100 MG/10ML IV SOLN
INTRAVENOUS | Status: DC | PRN
  Administered 2023-12-18: 60 mg via INTRAVENOUS

## 2023-12-18 MED ORDER — CHLORHEXIDINE GLUCONATE 0.12 % MT SOLN
15.0000 mL | Freq: Once | OROMUCOSAL | Status: AC
  Administered 2023-12-18: 15 mL via OROMUCOSAL

## 2023-12-18 MED ORDER — CEFAZOLIN SODIUM-DEXTROSE 2-4 GM/100ML-% IV SOLN
INTRAVENOUS | Status: AC
  Filled 2023-12-18: qty 100

## 2023-12-18 MED ORDER — BUPIVACAINE-EPINEPHRINE (PF) 0.5% -1:200000 IJ SOLN
INTRAMUSCULAR | Status: DC | PRN
  Administered 2023-12-18: 30 mL

## 2023-12-18 MED ORDER — DEXAMETHASONE SODIUM PHOSPHATE 10 MG/ML IJ SOLN
INTRAMUSCULAR | Status: DC | PRN
  Administered 2023-12-18: 10 mg via INTRAVENOUS

## 2023-12-18 MED ORDER — KETAMINE HCL 50 MG/5ML IJ SOSY
PREFILLED_SYRINGE | INTRAMUSCULAR | Status: AC
  Filled 2023-12-18: qty 5

## 2023-12-18 MED ORDER — BUPIVACAINE LIPOSOME 1.3 % IJ SUSP
INTRAMUSCULAR | Status: AC
  Filled 2023-12-18: qty 20

## 2023-12-18 MED ORDER — PROPOFOL 10 MG/ML IV BOLUS
INTRAVENOUS | Status: DC | PRN
  Administered 2023-12-18: 150 mg via INTRAVENOUS
  Administered 2023-12-18: 50 mg via INTRAVENOUS

## 2023-12-18 MED ORDER — SUGAMMADEX SODIUM 200 MG/2ML IV SOLN
INTRAVENOUS | Status: DC | PRN
  Administered 2023-12-18: 200 mg via INTRAVENOUS

## 2023-12-18 MED ORDER — VASOPRESSIN 20 UNIT/ML IV SOLN
INTRAVENOUS | Status: AC
Start: 2023-12-18 — End: 2023-12-18
  Filled 2023-12-18: qty 2

## 2023-12-18 MED ORDER — PROPOFOL 10 MG/ML IV BOLUS
INTRAVENOUS | Status: AC
Start: 2023-12-18 — End: 2023-12-18
  Filled 2023-12-18: qty 20

## 2023-12-18 MED ORDER — BUPIVACAINE-EPINEPHRINE (PF) 0.5% -1:200000 IJ SOLN
INTRAMUSCULAR | Status: AC
  Filled 2023-12-18: qty 30

## 2023-12-18 MED ORDER — PROPOFOL 10 MG/ML IV BOLUS
INTRAVENOUS | Status: AC
  Filled 2023-12-18: qty 20

## 2023-12-18 MED ORDER — ONDANSETRON HCL 4 MG/2ML IJ SOLN
INTRAMUSCULAR | Status: DC | PRN
  Administered 2023-12-18: 4 mg via INTRAVENOUS

## 2023-12-18 MED ORDER — BUPIVACAINE HCL (PF) 0.5 % IJ SOLN
INTRAMUSCULAR | Status: AC
  Filled 2023-12-18: qty 10

## 2023-12-18 MED ORDER — BUPIVACAINE LIPOSOME 1.3 % IJ SUSP
INTRAMUSCULAR | Status: DC | PRN
  Administered 2023-12-18: 20 mL

## 2023-12-18 MED ORDER — MIDAZOLAM HCL 2 MG/2ML IJ SOLN
1.0000 mg | INTRAMUSCULAR | Status: DC | PRN
  Administered 2023-12-18: 1 mg via INTRAVENOUS

## 2023-12-18 MED ORDER — KETOROLAC TROMETHAMINE 15 MG/ML IJ SOLN
15.0000 mg | Freq: Once | INTRAMUSCULAR | Status: AC
  Administered 2023-12-18: 15 mg via INTRAVENOUS

## 2023-12-18 MED ORDER — KETAMINE HCL 50 MG/5ML IJ SOSY
PREFILLED_SYRINGE | INTRAMUSCULAR | Status: DC | PRN
  Administered 2023-12-18: 30 mg via INTRAVENOUS
  Administered 2023-12-18 (×2): 10 mg via INTRAVENOUS

## 2023-12-18 MED ORDER — OXYCODONE HCL 5 MG PO TABS: 5.0000 mg | ORAL_TABLET | ORAL | 0 refills | Status: AC | PRN

## 2023-12-18 MED ORDER — PROPOFOL 1000 MG/100ML IV EMUL
INTRAVENOUS | Status: AC
  Filled 2023-12-18: qty 100

## 2023-12-18 MED ORDER — ORAL CARE MOUTH RINSE: 15.0000 mL | Freq: Once | OROMUCOSAL | Status: AC

## 2023-12-18 MED ORDER — FENTANYL CITRATE PF 50 MCG/ML IJ SOSY
50.0000 ug | PREFILLED_SYRINGE | Freq: Once | INTRAMUSCULAR | Status: AC
  Administered 2023-12-18: 50 ug via INTRAVENOUS

## 2023-12-18 MED ORDER — OXYCODONE HCL 5 MG PO TABS: 5.0000 mg | ORAL_TABLET | Freq: Once | ORAL | Status: DC | PRN

## 2023-12-18 MED ORDER — MIDAZOLAM HCL 2 MG/2ML IJ SOLN
INTRAMUSCULAR | Status: AC
  Filled 2023-12-18: qty 2

## 2023-12-18 MED ORDER — CEFAZOLIN SODIUM-DEXTROSE 2-4 GM/100ML-% IV SOLN
2.0000 g | INTRAVENOUS | Status: AC
  Administered 2023-12-18: 2 g via INTRAVENOUS

## 2023-12-18 MED ORDER — OXYCODONE HCL 5 MG/5ML PO SOLN: 5.0000 mg | Freq: Once | ORAL | Status: DC | PRN

## 2023-12-18 MED ORDER — BUPIVACAINE HCL (PF) 0.5 % IJ SOLN
INTRAMUSCULAR | Status: DC | PRN
  Administered 2023-12-18: 10 mL

## 2023-12-18 MED ORDER — CHLORHEXIDINE GLUCONATE 0.12 % MT SOLN
OROMUCOSAL | Status: AC
  Filled 2023-12-18: qty 15

## 2023-12-18 MED ORDER — RINGERS IRRIGATION IR SOLN
Status: DC | PRN
  Administered 2023-12-18: 1500 mL

## 2023-12-18 MED ORDER — LACTATED RINGERS IV SOLN: INTRAVENOUS | Status: DC

## 2023-12-18 MED ORDER — KETOROLAC TROMETHAMINE 15 MG/ML IJ SOLN
INTRAMUSCULAR | Status: AC
  Filled 2023-12-18: qty 1

## 2023-12-18 MED ORDER — PHENYLEPHRINE 80 MCG/ML (10ML) SYRINGE FOR IV PUSH (FOR BLOOD PRESSURE SUPPORT)
PREFILLED_SYRINGE | INTRAVENOUS | Status: DC | PRN
  Administered 2023-12-18: 80 ug via INTRAVENOUS

## 2023-12-18 MED ORDER — PHENYLEPHRINE HCL-NACL 20-0.9 MG/250ML-% IV SOLN
INTRAVENOUS | Status: AC
Start: 2023-12-18 — End: 2023-12-18
  Filled 2023-12-18: qty 250

## 2023-12-18 MED ORDER — FENTANYL CITRATE (PF) 100 MCG/2ML IJ SOLN: 25.0000 ug | INTRAMUSCULAR | Status: DC | PRN

## 2023-12-18 MED ORDER — FENTANYL CITRATE PF 50 MCG/ML IJ SOSY
PREFILLED_SYRINGE | INTRAMUSCULAR | Status: AC
Start: 2023-12-18 — End: 2023-12-18
  Filled 2023-12-18: qty 1

## 2023-12-18 SURGICAL SUPPLY — 45 items
ANCHOR DBL 2.6 SLF-PNCH FIBRTK (Anchor) IMPLANT
ANCHOR HEALICOIL REGEN 5.5 (Anchor) IMPLANT
ANCHOR QFIX 2.8 SUT MINI TAPE (Anchor) IMPLANT
BIT DRILL JUGRKNT W/NDL BIT2.9 (DRILL) IMPLANT
BLADE FULL RADIUS 3.5 (BLADE) ×2 IMPLANT
BUR ACROMIONIZER 4.0 (BURR) ×2 IMPLANT
CHLORAPREP W/TINT 26 (MISCELLANEOUS) ×2 IMPLANT
COVER MAYO STAND STRL (DRAPES) ×2 IMPLANT
DILATOR 5.5 THREADED HEALICOIL (MISCELLANEOUS) IMPLANT
ELECTRODE REM PT RTRN 9FT ADLT (ELECTROSURGICAL) ×2 IMPLANT
GAUZE SPONGE 4X4 12PLY STRL (GAUZE/BANDAGES/DRESSINGS) ×2 IMPLANT
GAUZE XEROFORM 1X8 LF (GAUZE/BANDAGES/DRESSINGS) ×2 IMPLANT
GLOVE BIO SURGEON STRL SZ7.5 (GLOVE) ×4 IMPLANT
GLOVE BIO SURGEON STRL SZ8 (GLOVE) ×4 IMPLANT
GLOVE BIOGEL PI IND STRL 8 (GLOVE) ×2 IMPLANT
GLOVE INDICATOR 8.0 STRL GRN (GLOVE) ×2 IMPLANT
GOWN STRL REUS W/ TWL LRG LVL3 (GOWN DISPOSABLE) ×2 IMPLANT
GOWN STRL REUS W/ TWL XL LVL3 (GOWN DISPOSABLE) ×2 IMPLANT
IV LR IRRIG 3000ML ARTHROMATIC (IV SOLUTION) ×2 IMPLANT
KIT CANNULA 8X76-LX IN CANNULA (CANNULA) ×2 IMPLANT
KIT KNEE FIBERTAK DISP (KITS) IMPLANT
KIT SUTURE 2.8 Q-FIX DISP (MISCELLANEOUS) IMPLANT
MANIFOLD NEPTUNE II (INSTRUMENTS) ×4 IMPLANT
MASK FACE SPIDER DISP (MASK) ×2 IMPLANT
MAT ABSORB FLUID 56X50 GRAY (MISCELLANEOUS) ×2 IMPLANT
NDL SUT 5 .5 CRC TPR PNT MAYO (NEEDLE) IMPLANT
PACK ARTHROSCOPY SHOULDER (MISCELLANEOUS) ×2 IMPLANT
PAD ABD DERMACEA PRESS 5X9 (GAUZE/BANDAGES/DRESSINGS) ×4 IMPLANT
PASSER SUT FIRSTPASS SELF (INSTRUMENTS) IMPLANT
PENCIL SMOKE EVACUATOR (MISCELLANEOUS) IMPLANT
SLING ARM LRG DEEP (SOFTGOODS) ×2 IMPLANT
SLING ULTRA II LG (MISCELLANEOUS) ×2 IMPLANT
SLING ULTRA II M (MISCELLANEOUS) IMPLANT
SPONGE T-LAP 18X18 ~~LOC~~+RFID (SPONGE) ×2 IMPLANT
STAPLER SKIN PROX 35W (STAPLE) ×2 IMPLANT
STRAP SAFETY 5IN WIDE (MISCELLANEOUS) ×2 IMPLANT
SUT ETHIBOND 0 MO6 C/R (SUTURE) ×2 IMPLANT
SUT VIC AB 2-0 CT1 TAPERPNT 27 (SUTURE) ×4 IMPLANT
TAPE MICROFOAM 4IN (TAPE) ×2 IMPLANT
TRAP FLUID SMOKE EVACUATOR (MISCELLANEOUS) ×2 IMPLANT
TUBE SET DOUBLEFLO INFLOW (TUBING) ×2 IMPLANT
TUBING CONNECTING 10 (TUBING) IMPLANT
WAND WEREWOLF FASTSEAL 6.0 (MISCELLANEOUS) IMPLANT
WAND WEREWOLF FLOW 90D (MISCELLANEOUS) ×2 IMPLANT
WATER STERILE IRR 500ML POUR (IV SOLUTION) ×2 IMPLANT

## 2023-12-18 NOTE — Anesthesia Procedure Notes (Signed)
 Procedure Name: Intubation Date/Time: 12/18/2023 8:52 AM  Performed by: Norleen Alberta HERO., CRNAPre-anesthesia Checklist: Patient identified, Patient being monitored, Timeout performed, Emergency Drugs available and Suction available Patient Re-evaluated:Patient Re-evaluated prior to induction Oxygen Delivery Method: Circle system utilized Preoxygenation: Pre-oxygenation with 100% oxygen Induction Type: IV induction Ventilation: Mask ventilation without difficulty Laryngoscope Size: 3 and McGrath Grade View: Grade I Tube type: Oral Tube size: 6.5 mm Number of attempts: 1 Airway Equipment and Method: Stylet Placement Confirmation: ETT inserted through vocal cords under direct vision, positive ETCO2 and breath sounds checked- equal and bilateral Secured at: 19 cm Tube secured with: Tape Dental Injury: Teeth and Oropharynx as per pre-operative assessment

## 2023-12-18 NOTE — Transfer of Care (Signed)
 Immediate Anesthesia Transfer of Care Note  Patient: Kristina Carrillo  Procedure(s) Performed: ARTHROSCOPY, SHOULDER WITH DEBRIDEMENT (Left: Shoulder) DECOMPRESSION, SUBACROMIAL SPACE (Left: Shoulder) REPAIR, ROTATOR CUFF, OPEN (Left: Shoulder) TENODESIS, BICEPS (Bilateral: Shoulder)  Patient Location: PACU  Anesthesia Type:General  Level of Consciousness: awake and patient cooperative  Airway & Oxygen Therapy: Patient Spontanous Breathing and Patient connected to nasal cannula oxygen  Post-op Assessment: Report given to RN and Post -op Vital signs reviewed and stable  Post vital signs: stable  Last Vitals:  Vitals Value Taken Time  BP 144/100 12/18/23 10:18  Temp    Pulse 73 12/18/23 10:20  Resp 15 12/18/23 10:20  SpO2 99 % 12/18/23 10:20  Vitals shown include unfiled device data.  Last Pain:  Vitals:   12/18/23 0714  TempSrc: Temporal  PainSc: 8          Complications: No notable events documented.

## 2023-12-18 NOTE — Anesthesia Preprocedure Evaluation (Signed)
 Anesthesia Evaluation  Patient identified by MRN, date of birth, ID band Patient awake    Reviewed: Allergy & Precautions, NPO status , Patient's Chart, lab work & pertinent test results  Airway Mallampati: III  TM Distance: >3 FB Neck ROM: full    Dental  (+) Chipped   Pulmonary neg pulmonary ROS   Pulmonary exam normal        Cardiovascular negative cardio ROS Normal cardiovascular exam     Neuro/Psych negative neurological ROS  negative psych ROS   GI/Hepatic Neg liver ROS,GERD  Medicated and Controlled,,  Endo/Other  negative endocrine ROS    Renal/GU      Musculoskeletal   Abdominal   Peds  Hematology negative hematology ROS (+)   Anesthesia Other Findings Past Medical History: No date: Anemia No date: Arthritis No date: GERD (gastroesophageal reflux disease) No date: Head ache     Comment:  MENSTRUAL 07/20/14; 10/06/15: History of mammogram     Comment:  BIRADS 2; NEG 2018: History of MRSA infection 06/16/2012; 09/21/15: History of Papanicolaou smear of cervix     Comment:  -/-; -/- No date: Rotator cuff tendinitis, left No date: Traumatic complete tear of left rotator cuff  Past Surgical History: 09/2014: COLONOSCOPY     Comment:  WNL - DR. REIN - REPEAT 8YRS 1991: DILATION AND CURETTAGE OF UTERUS No date: WISDOM TOOTH EXTRACTION  BMI    Body Mass Index: 27.46 kg/m      Reproductive/Obstetrics negative OB ROS                              Anesthesia Physical Anesthesia Plan  ASA: 2  Anesthesia Plan: General ETT   Post-op Pain Management: Regional block*   Induction: Intravenous  PONV Risk Score and Plan: 3 and Ondansetron , Dexamethasone  and Midazolam   Airway Management Planned: Oral ETT  Additional Equipment:   Intra-op Plan:   Post-operative Plan: Extubation in OR  Informed Consent: I have reviewed the patients History and Physical, chart, labs and  discussed the procedure including the risks, benefits and alternatives for the proposed anesthesia with the patient or authorized representative who has indicated his/her understanding and acceptance.     Dental Advisory Given  Plan Discussed with: Anesthesiologist, CRNA and Surgeon  Anesthesia Plan Comments: (Patient consented for risks of anesthesia including but not limited to:  - adverse reactions to medications - damage to eyes, teeth, lips or other oral mucosa - nerve damage due to positioning  - sore throat or hoarseness - Damage to heart, brain, nerves, lungs, other parts of body or loss of life  Patient voiced understanding and assent.)        Anesthesia Quick Evaluation

## 2023-12-18 NOTE — H&P (Signed)
 History of Present Illness:  Kristina Carrillo is a 58 y.o. female who presents for follow-up of her left shoulder pain secondary to impingement/tendinopathy with a probable rotator cuff tear. The patient was last seen for the symptoms 1 month ago. She notes little change in her symptoms since her last visit. She rates her pain at 8/10 on today's visit. She has been taking Norco as necessary with limited benefit. She has pain with activities at or above shoulder level, as well as when trying to sleep at night. She denies any recent reinjury to the shoulder, and denies any numbness or paresthesias down her arm to her hand. Since her last visit, she has undergone an MRI scan of presents today to review these results.  Current Outpatient Medications:  azelastine (ASTELIN) 137 mcg nasal spray PLACE 1 SPRAY INTO BOTH NOSTRILS 2 (TWO) TIMES DAILY 30 mL 11  brompheniramine-pseudoephed-DM (BROMFED DM) 2-30-10 mg/5 mL syrup Take 5 mLs by mouth every 6 (six) hours as needed 118 mL 0  fluticasone (FLONASE) 50 mcg/actuation nasal spray Place 1 spray into both nostrils once daily.  HYDROcodone-acetaminophen (NORCO) 5-325 mg tablet Take 1 tablet by mouth 3 (three) times daily as needed for Pain 30 tablet 0  MULTIVITAMIN NO.44-VIT D3-K ORAL Take by mouth.   Allergies:  Penicillin Unknown   Past Medical History:  Allergy  GERD (gastroesophageal reflux disease)  Headache   Past Surgical History:  DILATION & CURRETTAGE 1991  COLONOSCOPY 09/30/2014 (Entire examined colon is normal/Repeat 54yrs/MGR)   Family History:  Alzheimer's disease Father  Diabetes type II Brother Publishing copy  Alzheimer's disease Paternal Aunt  Alzheimer's disease Paternal Uncle  Colon cancer Maternal Grandmother Grandmother  Alzheimer's disease Paternal Grandmother Product manager   Social History:   Socioeconomic History:  Marital status: Married  Number of children: 2  Occupational History  Occupation: retired  Comment: Engineer, site   Tobacco Use  Smoking status: Never  Smokeless tobacco: Never  Vaping Use  Vaping status: Never Used  Substance and Sexual Activity  Alcohol use: Not Currently  Alcohol/week: 2.0 standard drinks of alcohol  Types: 2 Cans of beer per week  Comment: occ  Drug use: No  Sexual activity: Yes  Partners: Male  Birth control/protection: Post-menopausal  Comment: Spouse has vasectomy  Other Topics Concern  Would you please tell us  about the people who live in your home, your pets, or anything else important to your social life? Yes  Social History Narrative  Marital status: married x 32 years  Children: 2 children (25, 30), 1 grandson in 8th grade at [employer]  Lives: with husband, 1 grandson (13). Father with dementia; supports parents regularly.  Employment: retired from [employer] in July 2021; working four hours per day at [employer].  Exercise: walking 1.5-2 miles daily in 2024.   Social Drivers of Corporate investment banker Strain: Low Risk (03/07/2023)  Overall Financial Resource Strain (CARDIA)  Difficulty of Paying Living Expenses: Not very hard  Food Insecurity: No Food Insecurity (03/07/2023)  Hunger Vital Sign  Worried About Running Out of Food in the Last Year: Never true  Ran Out of Food in the Last Year: Never true  Transportation Needs: No Transportation Needs (03/07/2023)  PRAPARE - Risk analyst (Medical): No  Lack of Transportation (Non-Medical): No   Review of Systems:  A comprehensive 14 point ROS was performed, reviewed, and the pertinent orthopaedic findings are documented in the HPI.  Physical Exam: Vitals:  12/06/23 0908  BP: 110/60  Weight: 76.7 kg (169 lb)  Height: 165.1 cm (5' 5)  PainSc: 8  PainLoc: Shoulder   General/Constitutional: The patient appears to be well-nourished, well-developed, and in no acute distress. Neuro/Psych: Normal mood and affect, oriented to person, place and  time. Eyes: Non-icteric. Pupils are equal, round, and reactive to light, and exhibit synchronous movement. ENT: Unremarkable. Lymphatic: No palpable adenopathy. Respiratory: Lungs clear to auscultation, Normal chest excursion, No wheezes, and Non-labored breathing Cardiovascular: Regular rate and rhythm. No murmurs. and No edema, swelling or tenderness, except as noted in detailed exam. Integumentary: No impressive skin lesions present, except as noted in detailed exam. Musculoskeletal: Unremarkable, except as noted in detailed exam.  Left shoulder exam: SKIN: Normal SWELLING: None WARMTH: None LYMPH NODES: No adenopathy palpable CREPITUS: None TENDERNESS: Mild tenderness along lateral acromion and over anterolateral shoulder ROM (active):  Forward flexion: 115 degrees Abduction: 100 degrees Internal rotation: Left PSIS ROM (passive):  Forward flexion: 155 degrees Abduction: 145 ER/IR at 90 abd: 80 degrees/55 degrees  She describes moderate pain at the extremes of all motions.  STRENGTH: Forward flexion: 4/5 Abduction: 4/5 External rotation: 4-4+/5 Internal rotation: 4+/5 Pain with RC testing: Moderate pain with resisted forward flexion and mild pain with resisted abduction and external rotation  STABILITY: Normal  SPECIAL TESTS: Vonzell' test: Mildly positive Speed's test: Negative Capsulitis - pain w/ passive ER: No Crossed arm test: Mildly positive Crank: Not evaluated Anterior apprehension: Negative Posterior apprehension: Not evaluated  She remains neurovascularly intact to the left upper extremity.  Imaging:  A recent MRI scan of the left shoulder is available for review and has been reviewed by myself. By report, the study demonstrates evidence of a full-thickness tear of the supraspinatus tendon as well as some mild insertional tendinopathy of the infraspinatus tendon without any partial or full-thickness tearing. Both the films of report were reviewed by myself  and discussed with the patient.  Assessment: 1. Traumatic complete tear of left rotator cuff.  2. Rotator cuff tendinitis, left.   Plan: The treatment options were discussed with the patient. In addition, patient educational materials were provided regarding the diagnosis and treatment options. The patient is quite frustrated by her symptoms and function limitations, and is ready to consider more aggressive treatment options. Therefore, I have recommended a surgical procedure, specifically a left shoulder arthroscopy with debridement, decompression, rotator cuff repair, and probable biceps tenodesis. The procedure was discussed with the patient, as were the potential risks (including bleeding, infection, nerve and/or blood vessel injury, persistent or recurrent pain, failure of the repair, progression of arthritis, need for further surgery, blood clots, strokes, heart attacks and/or arhythmias, pneumonia, etc.) and benefits. The patient states her understanding and wishes to proceed. All of the patient's questions and concerns were answered. She can call any time with further concerns. She will follow up post-surgery, routine.    H&P reviewed and patient re-examined. No changes.

## 2023-12-18 NOTE — Op Note (Signed)
 12/18/2023  10:12 AM  Patient:   Kristina Carrillo  Pre-Op Diagnosis:   Impingement/tendinopathy with traumatic rotator cuff tear, left shoulder.  Post-Op Diagnosis:   Impingement/tendinopathy with rotator cuff tear and biceps tendinopathy, left shoulder.  Procedure:   Limited arthroscopic debridement, arthroscopic subacromial decompression, mini-open rotator cuff repair, and mini-open biceps tenodesis, left shoulder.  Anesthesia:   General endotracheal with interscalene block using Exparel  placed preoperatively by the anesthesiologist.  Surgeon:   DOROTHA Reyes Maltos, MD  Assistant:   Toribio Alas, RNFA  Findings:   As above. There was a full-thickness tear involving the anterior and middle portions of the supraspinatus tendon. The remainder of the rotator cuff was in satisfactory condition. The biceps tendon demonstrated evidence of lip sticking without partial or full-thickness tears. The articular surfaces of the glenoid and humerus both are in satisfactory condition, as well as the labrum.  Complications:   None  Estimated blood loss:   25 cc  Fluids:   600 cc  Tourniquet time:   None  Drains:   None  Closure:   Staples      Brief clinical note:   The patient is a 58 year old female with a history of progressively worsening left shoulder pain. The patient's symptoms have progressed despite medications, activity modification, etc. The patient's history and examination are consistent with impingement/tendinopathy with a rotator cuff tear. These findings were confirmed by MRI scan. The patient presents at this time for definitive management of these shoulder symptoms.  Procedure:   The patient underwent placement of an interscalene block using Exparel  by the anesthesiologist in the preoperative holding area before being brought into the operating room and lain in the supine position. The patient then underwent general endotracheal intubation and anesthesia before being repositioned  in the beach chair position using the beach chair positioner. The left shoulder and upper extremity were prepped with ChloraPrep solution before being draped sterilely. Preoperative antibiotics were administered. A timeout was performed to confirm the proper surgical site before the expected portal sites and incision site were injected with 0.5% Sensorcaine  with epinephrine .   A posterior portal was created and the glenohumeral joint thoroughly inspected with the findings as described above. An anterior portal was created using an outside-in technique. The labrum and rotator cuff were further probed, again confirming the above-noted findings. Areas of synovitis were debrided back to stable margins, as was the torn margins of the supraspinatus tendon tear. The ArthroCare wand was inserted and used to release the biceps tendon from its labral anchor.  It also was used to obtain hemostasis as well as to anneal the labrum superiorly and anteriorly. The instruments were removed from the joint after suctioning the excess fluid.  The camera was repositioned through the posterior portal into the subacromial space. A separate lateral portal was created using an outside-in technique. The 3.5 mm full-radius resector was introduced and used to perform a subtotal bursectomy. The ArthroCare wand was then inserted and used to remove the periosteal tissue off the undersurface of the anterior third of the acromion as well as to recess the coracoacromial ligament from its attachment along the anterior and lateral margins of the acromion. The 4.0 mm acromionizing bur was introduced and used to complete the decompression by removing the undersurface of the anterior third of the acromion. The full radius resector was reintroduced to remove any residual bony debris before the ArthroCare wand was reintroduced to obtain hemostasis. The instruments were then removed from the subacromial space after suctioning  the excess fluid.  An  approximately 4-5 cm incision was made over the anterolateral aspect of the shoulder beginning at the anterolateral corner of the acromion and extending distally in line with the bicipital groove. This incision was carried down through the subcutaneous tissues to expose the deltoid fascia. The raphae between the anterior and middle thirds was identified and this plane developed to provide access into the subacromial space. Additional bursal tissues were debrided sharply using Metzenbaum scissors. The rotator cuff tear was readily identified.   The bicipital groove was identified by palpation and opened for 1-1.5 cm. The biceps tendon stump was retrieved through this defect. The floor of the bicipital groove was roughened with a curet. Two Arthrex double loaded Knotless Knee FiberTak anchors were inserted approximately 1.5 cm apart along the bicipital groove after drilling the appropriate holes. These anchors were set before the biceps tendon was passed from distal to proximal through each set of suture loops. With the appropriate tension maintained on the biceps tendon, first the proximal and then the distal loops were tightened securely to effect the tenodesis. One limb of the more proximal suture loop was passed through the tendon using a free needle to act as a ripstop before tying it together with the other suture loop suture.   The margins of the rotator cuff tear were debrided sharply with a #15 blade and the exposed greater tuberosity roughened with a rongeur. The tear was repaired using two [employer] 2.8 mm Q-Fix anchors. These sutures were then brought back laterally and secured using two [employer] Nephew Healicoil knotless RegeneSorb anchors to create a two-layer closure. An apparent watertight closure was obtained.  The wound was copiously irrigated with sterile saline solution before the deltoid raphae was reapproximated using 2-0 Vicryl interrupted sutures. The subcutaneous tissues were  closed in two layers using 2-0 Vicryl interrupted sutures before the skin was closed using staples. The portal sites also were closed using staples. A sterile bulky dressing was applied to the shoulder before the arm was placed into a shoulder immobilizer. The patient was then awakened, extubated, and returned to the recovery room in satisfactory condition after tolerating the procedure well.

## 2023-12-18 NOTE — Anesthesia Postprocedure Evaluation (Signed)
 Anesthesia Post Note  Patient: Kristina Carrillo  Procedure(s) Performed: ARTHROSCOPY, SHOULDER WITH DEBRIDEMENT (Left: Shoulder) DECOMPRESSION, SUBACROMIAL SPACE (Left: Shoulder) REPAIR, ROTATOR CUFF, OPEN (Left: Shoulder) TENODESIS, BICEPS (Bilateral: Shoulder)  Patient location during evaluation: PACU Anesthesia Type: General Level of consciousness: awake and alert Pain management: pain level controlled Vital Signs Assessment: post-procedure vital signs reviewed and stable Respiratory status: spontaneous breathing, nonlabored ventilation, respiratory function stable and patient connected to nasal cannula oxygen Cardiovascular status: blood pressure returned to baseline and stable Postop Assessment: no apparent nausea or vomiting Anesthetic complications: no   There were no known notable events for this encounter.   Last Vitals:  Vitals:   12/18/23 1045 12/18/23 1100  BP: 110/77 123/74  Pulse: 70 67  Resp: 10 17  Temp: (!) 36.4 C (!) 36 C  SpO2: 97% 95%    Last Pain:  Vitals:   12/18/23 1100  TempSrc: Temporal  PainSc: 6                  Debby Mines

## 2023-12-18 NOTE — Anesthesia Procedure Notes (Signed)
 Anesthesia Regional Block: Interscalene brachial plexus block   Pre-Anesthetic Checklist: , timeout performed,  Correct Patient, Correct Site, Correct Laterality,  Correct Procedure, Correct Position, site marked,  Risks and benefits discussed,  Surgical consent,  Pre-op evaluation,  At surgeon's request and post-op pain management  Laterality: Left  Prep: chloraprep       Needles:  Injection technique: Single-shot  Needle Type: Echogenic Needle     Needle Length: 4cm  Needle Gauge: 25     Additional Needles:   Procedures:,,,, ultrasound used (permanent image in chart),,    Narrative:  Injection made incrementally with aspirations every 5 mL.  Performed by: Personally  Anesthesiologist: Leavy Ned, MD  Additional Notes: Patient's chart reviewed and they were deemed appropriate candidate for procedure, at surgeon's request. Patient educated about risks, benefits, and alternatives of the block including but not limited to: temporary or permanent nerve damage, bleeding, infection, damage to surround tissues, pneumothorax, hemidiaphragmatic paralysis, unilateral Horner's syndrome, block failure, local anesthetic toxicity. Patient expressed understanding. A formal time-out was conducted consistent with institution rules.  Monitors were applied, and minimal sedation used (see nursing record). The site was prepped with skin prep and allowed to dry, and sterile gloves were used. A high frequency linear ultrasound probe with probe cover was utilized throughout. C5-7 nerve roots located and appeared anatomically normal, local anesthetic injected around them, and echogenic block needle trajectory was monitored throughout. Aspiration performed every 5ml. Lung and blood vessels were avoided. All injections were performed without resistance and free of blood and paresthesias. The patient tolerated the procedure well.  Injectate: 20ml exparel  + 10ml 0.5% bupivacaine 

## 2023-12-18 NOTE — Discharge Instructions (Addendum)
 Orthopedic discharge instructions: Keep dressing dry and intact.  May shower after dressing changed on post-op day #4 (Sunday).  Cover staples/sutures with Band-Aids after drying off. Apply ice frequently to shoulder or use Polar Care device. Take Aleve 2 tablets BID with meals for 3-5 days, then as necessary. Take oxycodone  as prescribed when needed.  May supplement with ES Tylenol if necessary. Keep shoulder immobilizer on at all times except may remove for bathing purposes. Follow-up in 10-14 days or as scheduled.SHOULDER SLING IMMOBILIZER   VIDEO Slingshot 2 Shoulder Brace Application - YouTube ---[email or web address].porter.info/  INSTRUCTIONS While supporting the injured arm, slide the forearm into the sling. Wrap the adjustable shoulder strap around the neck and shoulders and attach the strap end to the sling using  the "alligator strap tab."  Adjust the shoulder strap to the required length. Position the shoulder pad behind the neck. To secure the shoulder pad location (optional), pull the shoulder strap away from the shoulder pad, unfold the hook material on the top of the pad, then press the shoulder strap back onto the hook material to secure the pad in place. Attach the closure strap across the open top of the sling. Position the strap so that it holds the arm securely in the sling. Next, attach the thumb strap to the open end of the sling between the thumb and fingers. After sling has been fit, it may be easily removed and reapplied using the quick release buckle on shoulder strap. If a neutral pillow or 15 abduction pillow is included, place the pillow at the waistline. Attach the sling to the pillow, lining up hook material on the pillow with the loop on sling. Adjust the waist strap to fit.  If waist strap is too long, cut it to fit. Use the small piece of double sided hook material (located on top of the pillow) to secure the strap end. Place the double sided hook  material on the inside of the cut strap end and secure it to the waist strap.     If no pillow is included, attach the waist strap to the sling and adjust to fit.    Washing Instructions: Straps and sling must be removed and cleaned regularly depending on your activity level and perspiration. Hand wash straps and sling in cold water with mild detergent, rinse, air dry

## 2023-12-19 ENCOUNTER — Encounter: Payer: Self-pay | Admitting: Surgery

## 2024-04-07 ENCOUNTER — Other Ambulatory Visit: Payer: Self-pay | Admitting: Family Medicine

## 2024-04-07 DIAGNOSIS — Z1231 Encounter for screening mammogram for malignant neoplasm of breast: Secondary | ICD-10-CM

## 2024-04-21 ENCOUNTER — Encounter: Payer: Self-pay | Admitting: Surgery

## 2024-05-11 ENCOUNTER — Ambulatory Visit

## 2024-06-01 ENCOUNTER — Ambulatory Visit
